# Patient Record
Sex: Male | Born: 1955
Health system: Southern US, Community
[De-identification: ages and names within clinical notes are randomized; demographics above are authoritative.]

## PROBLEM LIST (undated history)

## (undated) DIAGNOSIS — F209 Schizophrenia, unspecified: Secondary | ICD-10-CM

## (undated) DIAGNOSIS — K219 Gastro-esophageal reflux disease without esophagitis: Secondary | ICD-10-CM

## (undated) DIAGNOSIS — R451 Restlessness and agitation: Secondary | ICD-10-CM

## (undated) DIAGNOSIS — E785 Hyperlipidemia, unspecified: Secondary | ICD-10-CM

## (undated) HISTORY — DX: Schizophrenia, unspecified: F20.9

## (undated) HISTORY — DX: Hyperlipidemia, unspecified: E78.5

## (undated) HISTORY — DX: Gastro-esophageal reflux disease without esophagitis: K21.9

## (undated) HISTORY — PX: TONSILLECTOMY: SUR1361

---

## 2003-01-31 ENCOUNTER — Ambulatory Visit (HOSPITAL_COMMUNITY): Admission: RE | Admit: 2003-01-31 | Discharge: 2003-01-31 | Payer: Self-pay | Admitting: Family Medicine

## 2003-10-27 ENCOUNTER — Ambulatory Visit: Payer: Self-pay | Admitting: Psychiatry

## 2004-01-30 ENCOUNTER — Ambulatory Visit: Payer: Self-pay | Admitting: Psychiatry

## 2004-05-24 ENCOUNTER — Ambulatory Visit: Payer: Self-pay | Admitting: Psychiatry

## 2004-08-23 ENCOUNTER — Ambulatory Visit: Payer: Self-pay | Admitting: Psychiatry

## 2004-11-13 ENCOUNTER — Ambulatory Visit: Payer: Self-pay | Admitting: Psychiatry

## 2005-02-07 ENCOUNTER — Ambulatory Visit: Payer: Self-pay | Admitting: Psychiatry

## 2005-05-07 ENCOUNTER — Ambulatory Visit (HOSPITAL_COMMUNITY): Payer: Self-pay | Admitting: Psychiatry

## 2005-08-13 ENCOUNTER — Ambulatory Visit (HOSPITAL_COMMUNITY): Payer: Self-pay | Admitting: Psychiatry

## 2005-11-12 ENCOUNTER — Ambulatory Visit (HOSPITAL_COMMUNITY): Payer: Self-pay | Admitting: Psychiatry

## 2006-04-17 ENCOUNTER — Ambulatory Visit (HOSPITAL_COMMUNITY): Payer: Self-pay | Admitting: Psychiatry

## 2006-08-14 ENCOUNTER — Ambulatory Visit (HOSPITAL_COMMUNITY): Payer: Self-pay | Admitting: Psychiatry

## 2006-12-02 ENCOUNTER — Ambulatory Visit (HOSPITAL_COMMUNITY): Payer: Self-pay | Admitting: Psychiatry

## 2007-04-14 ENCOUNTER — Ambulatory Visit (HOSPITAL_COMMUNITY): Payer: Self-pay | Admitting: Psychiatry

## 2007-08-18 ENCOUNTER — Ambulatory Visit (HOSPITAL_COMMUNITY): Payer: Self-pay | Admitting: Psychiatry

## 2008-01-12 ENCOUNTER — Ambulatory Visit (HOSPITAL_COMMUNITY): Payer: Self-pay | Admitting: Psychiatry

## 2008-05-24 ENCOUNTER — Ambulatory Visit (HOSPITAL_COMMUNITY): Payer: Self-pay | Admitting: Psychiatry

## 2008-07-21 ENCOUNTER — Ambulatory Visit (HOSPITAL_COMMUNITY): Payer: Self-pay | Admitting: Psychiatry

## 2008-09-22 ENCOUNTER — Ambulatory Visit (HOSPITAL_COMMUNITY): Payer: Self-pay | Admitting: Psychiatry

## 2009-02-23 ENCOUNTER — Ambulatory Visit (HOSPITAL_COMMUNITY): Payer: Self-pay | Admitting: Psychiatry

## 2009-06-20 ENCOUNTER — Ambulatory Visit (HOSPITAL_COMMUNITY): Payer: Self-pay | Admitting: Psychiatry

## 2009-09-28 ENCOUNTER — Ambulatory Visit (HOSPITAL_COMMUNITY): Payer: Self-pay | Admitting: Psychiatry

## 2010-03-08 ENCOUNTER — Ambulatory Visit (HOSPITAL_COMMUNITY)
Admission: RE | Admit: 2010-03-08 | Discharge: 2010-03-08 | Payer: Self-pay | Source: Home / Self Care | Attending: Psychiatry | Admitting: Psychiatry

## 2010-06-07 ENCOUNTER — Encounter (INDEPENDENT_AMBULATORY_CARE_PROVIDER_SITE_OTHER): Payer: Medicare Other | Admitting: Psychiatry

## 2010-06-07 DIAGNOSIS — F200 Paranoid schizophrenia: Secondary | ICD-10-CM

## 2010-07-03 NOTE — Group Therapy Note (Signed)
NAME:  NYZIR, DUBOIS NO.:  [id number]   MEDICAL RECORD NO.:  [id number]           PATIENT TYPE:   LOCATION:                                 FACILITY:   PHYSICIAN:  Syed T. Arfeen, M.D.        DATE OF BIRTH:                                 PROGRESS NOTE   The patient came in today with his follow-up appointment with his sister  who came first time with him for his appointment.  The sister endorsed  that the patient has been talking to himself more than usual when she  brought him to her home for a visit.  She is also concerned sometimes  the patient does not like white people and she had another brother whose  girlfriend is white and whenever she comes home the patient feels very  uncomfortable.  However, there are no violence and agitation. Usually  the patient stays  at adult home and very rarely he goes to visit his  sister.  On his last visit with [place] we have increased the lorazepam to  four times a day as the patient was  having anxiety and fear of dying.  The patient reported that he has been doing much better since lorazepam  was increased to four times a day though, he is still appears somewhat  disorganized, talking to self, and laughing to self, but he has been  sleeping good and denies any suicidal thoughts or homicidal thoughts.  His thought process remains disorganized and illogical and his speech  remains incoherent which is his baseline.   I talked to his sister about the medication and dosage:  1. He has been on moderate dose of Thorazine which is 200 mg four      tablets a day.  2. Risperdal 3 mg twice a day.  3. Cogentin 0.5 mg twice a day.  4. Lorazepam 0.5 mg four times a day.   The patient reported no side effects of the medication and felt these  medicines are helping him to calm down.   The patient has no concern living in adult home and he has no issue with  the staff there.   ASSESSMENT:  Schizophrenia, chronic disorganized type.   The patient still has  residual symptoms of disorganized thinking however, there is no  management problem and he has been taking his medication at regular  doses.  He denies any suicidal thoughts homicidal thoughts and appears  cooperative and pleasant.   PLAN:  We will continue his current medication which is:  a.  Thorazine 1200 mg a day.  b.  Haldol 6 mg a day.  c.  Cogentin 0.5 twice a day.  d.  Ativan 0.5 mg four times a day.  I discussed with him and his sister the risk and benefits of the  medication.  I advised his sister to tell his brother not to bring his  white girlfriend at home when the patient is visiting her, which she  acknowledged. I reviewed the blood work which was done on June 3.  All  results are within normal limit including his liver function test and  blood  glucose.  I will see him in 4 months.      Syed T. Lolly Mustache, M.D.  Electronically Signed     STA/MEDQ  D:  09/22/2008  T:  09/22/2008  Job:  [id number]

## 2010-07-05 ENCOUNTER — Encounter (HOSPITAL_COMMUNITY): Payer: Medicare Other | Admitting: Psychiatry

## 2010-07-05 ENCOUNTER — Encounter (INDEPENDENT_AMBULATORY_CARE_PROVIDER_SITE_OTHER): Payer: Medicare Other | Admitting: Psychiatry

## 2010-07-05 DIAGNOSIS — F200 Paranoid schizophrenia: Secondary | ICD-10-CM

## 2010-08-30 ENCOUNTER — Encounter (INDEPENDENT_AMBULATORY_CARE_PROVIDER_SITE_OTHER): Payer: Medicare Other | Admitting: Psychiatry

## 2010-08-30 DIAGNOSIS — F200 Paranoid schizophrenia: Secondary | ICD-10-CM

## 2010-09-27 ENCOUNTER — Encounter (INDEPENDENT_AMBULATORY_CARE_PROVIDER_SITE_OTHER): Payer: Medicare Other | Admitting: Psychiatry

## 2010-09-27 DIAGNOSIS — F200 Paranoid schizophrenia: Secondary | ICD-10-CM

## 2010-11-08 ENCOUNTER — Encounter (INDEPENDENT_AMBULATORY_CARE_PROVIDER_SITE_OTHER): Payer: Medicare Other | Admitting: Psychiatry

## 2010-11-08 DIAGNOSIS — F200 Paranoid schizophrenia: Secondary | ICD-10-CM

## 2010-12-06 ENCOUNTER — Encounter (INDEPENDENT_AMBULATORY_CARE_PROVIDER_SITE_OTHER): Payer: Medicare Other | Admitting: Psychiatry

## 2010-12-06 DIAGNOSIS — F259 Schizoaffective disorder, unspecified: Secondary | ICD-10-CM

## 2010-12-07 NOTE — Progress Notes (Signed)
NAMEKOREN, SERMERSHEIM NO.:  [id number]  MEDICAL RECORD NO.:  [id number]  LOCATION:  [facility]                           FACILITY:  [facility]  PHYSICIAN:  Staley Budzinski T. Scotlynn Noyes, M.D.   DATE OF BIRTH:  1955-12-22                                PROGRESS NOTE   The patient came in today with the staff off the adult home for his appointment.  On the last visit we had kept the same medication as the patient was noticed more irritable, angry and easily agitated.  He is taking multiple psychotropic medications including 2 antipsychotic medications.  We are in the process of slow titration of the Risperdal. At some point would like him to be on 1 antipsychotic medication, Thorazine.  As per the staff the patient has been doing better in past 4 weeks.  He has been sleeping good and has no further episodes of agitation or anger, though he remains preoccupied and disorganized at times but not a management problem at adult home.  I reviewed his medications in length.  Per the staff the patient has been sleeping good and has no issue taking his.  CURRENT MEDICATIONS: 1. Polyethylene glycol 3350 one capsule in 8 ounces of liquid once a     day. 2. Lorazepam 0.5 mg 4 times a day. 3. Omeprazole 20 mg 1 capsule twice a day. 4. Thorazine 200 mg tablet 1 in the morning, 1 at noon and 4 at     bedtime. 5. Benztropine 0.5 mg twice a day. 6. Vitamin D 2000 international unit 1 capsule twice a day. 7. Risperdal 2 mg 1 tablet twice a day. 8. Amitiza 24 mcg 1 capsule a day.  ALLERGIES: The patient has no known drug allergies. Weight, His weight today was 192 pounds.  Mental status examination. The patient is fairly groomed and appears to be in his stated age.  He is unshaved and has some dirt on his clothes.  His speech is at time incoherent, thought process was illogical, less goal-directed but he was cooperative.  He has limited eye contact.  He continued to be noticed talking to himself as  looked like responding to internal stimuli, which has been his baseline.  He denies any suicidal thoughts or homicidal thoughts.  His attention and concentration was fair.  Thought process was circumstantial.  But able to follow directions.  He is alert and oriented x2.  His insight judgment and impulse control is fair.  DIAGNOSES: AXIS I: Schizophrenia, disorganized type. AXIS II:  Deferred. AXIS III:  Mild obesity, dyslipidemia, gastroesophageal reflux disease. AXIS IV:  Mild to moderate. AXIS V:  50.  PATIENT PRIMARY CARE DOCTOR: Colon Branch, MD.  PLAN OF TREATMENT: We will try to reduce further Risperdal to 1 mg twice a day, as patient is already on Thorazine with moderate dose.  He will continue his Cogentin and lorazepam from his primary care doctor.  I explained the risks and benefits of medication.  I also explained to the staff that if he started to get more irritable angry and agitated they need to call us immediately.  I will see him again in 4 weeks.  Jatasia Gundrum T. Lolly Mustache, M.D.     STA/MEDQ  D:  12/06/2010  T:  12/06/2010  Job:  [id number]  Electronically Signed by Kathryne Sharper M.D. on 12/07/2010 11:23:11 AM

## 2011-01-03 ENCOUNTER — Encounter (HOSPITAL_COMMUNITY): Payer: Self-pay | Admitting: Psychiatry

## 2011-01-03 ENCOUNTER — Ambulatory Visit (INDEPENDENT_AMBULATORY_CARE_PROVIDER_SITE_OTHER): Payer: Medicare Other | Admitting: Psychiatry

## 2011-01-03 ENCOUNTER — Encounter (HOSPITAL_COMMUNITY): Payer: Medicare Other | Admitting: Psychiatry

## 2011-01-03 VITALS — Wt 189.0 lb

## 2011-01-03 DIAGNOSIS — F259 Schizoaffective disorder, unspecified: Secondary | ICD-10-CM

## 2011-01-03 MED ORDER — RISPERIDONE 1 MG PO TABS: 1.0000 mg | ORAL_TABLET | Freq: Two times a day (BID) | ORAL | Status: DC

## 2011-01-03 MED ORDER — LORAZEPAM 0.5 MG PO TABS: 0.5000 mg | ORAL_TABLET | Freq: Four times a day (QID) | ORAL | Status: DC

## 2011-01-03 MED ORDER — CHLORPROMAZINE HCL 200 MG PO TABS: ORAL_TABLET | ORAL | Status: DC

## 2011-01-03 MED ORDER — DIVALPROEX SODIUM ER 250 MG PO TB24: 250.0000 mg | ORAL_TABLET | Freq: Two times a day (BID) | ORAL | Status: DC

## 2011-01-03 MED ORDER — BENZTROPINE MESYLATE 0.5 MG PO TABS: 0.5000 mg | ORAL_TABLET | Freq: Two times a day (BID) | ORAL | Status: DC

## 2011-01-03 NOTE — Telephone Encounter (Signed)
This encounter was created in error - please disregard.

## 2011-01-03 NOTE — Progress Notes (Signed)
Patient came today for his followup appointment with the staff of adult home. The staff endorse that patient has been seen more irritable angry and agitated and even of police to rescue him. On weekends he was very irritable and and seen talking to himself. On last visit we have reduce his Risperdal as patient is taking already Thorazine in a moderate does. The [place] process of lowering his Risperdal to like to keep him on one antipsychotic. During the visit patient was seen constantly talking to himself easily irritable and at times accusing the staff member that maybe he is getting poisons from someone else. The staff noticed that he is also not sleeping very well and remains very preoccupied with himself. Though there is no aggression and one behavior but at times he need constant redirection and counseling. I review his chart patient has long-standing schizophrenia [facility] he has been taking 2 antipsychotic for a long time but his current regime does not show that he is on any mood stabilizer.  Current medication. Lorazepam 0.5 mg 4 times a day because of poorly milligram 1 capsule twice a day Thorazine 200 mg one in the morning one at noon and 4 at bedtime benztropine 0.5 mg twice a day vitamin D 2000 international unit 1 capsule twice a day Risperdal 1 mg twice a day and Amitiza 24 mcg  capsule a day  Medical history hyperlipidemia and GERD vitamin D deficiency IBS  Mental status examination Patient is poorly groomed and appears restless. He is unshaved and have some dirt on his clothes. His his speech is incoherent and his thought processes are logical and less goal-directed. He is fairly cooperative and did not show any signs of agitation or anger but his attention and concentration is poor. He was noticed a continuously talking to himself but he denies any active or passive suicidal thinking. He is notice more hypervigilant but able to follow directions. His  thought processes disorganized and his speech is rambling and incoherent. He endorsed some paranoid thinking about the staff but there were no delusions noted he has no extrapyramidal side effects noted. He is alert and oriented x2 he has difficulty recalling old events. His insight judgment is poor and impulse control is fair.  Assessment. Axis I Schizophrenia chronic disorganized type Axis II deferred Axis III hyperlipidemia and GERD Axis IV moderate Axis V 45 Plan I will start Depakote 250 mg twice a day for his mood lability and paranoid thinking at this time the will not further reduce the Risperdal and he will continue Risperdal 1 mg twice a day along with Thorazine 800 mg a day I will continue his Cogentin and Ativan which he requires on a daily basis. I explained to the staff member if he start getting more agitated and violent then call 911 go to local ER. I will see him again in 3 weeks. I explained the risks and benefits of medication in detail. Time spent 30 minutes

## 2011-01-31 ENCOUNTER — Ambulatory Visit (INDEPENDENT_AMBULATORY_CARE_PROVIDER_SITE_OTHER): Payer: Medicare Other | Admitting: Psychiatry

## 2011-01-31 DIAGNOSIS — F209 Schizophrenia, unspecified: Secondary | ICD-10-CM

## 2011-01-31 MED ORDER — RISPERIDONE 2 MG PO TABS: 2.0000 mg | ORAL_TABLET | Freq: Two times a day (BID) | ORAL | Status: DC

## 2011-01-31 NOTE — Progress Notes (Signed)
Patient came today for his followup appointment with the staff of adult home. The staff endorse that patient has been seen more irritable angry and agitated. He is talking loud and praying most of the time.  We are trying to reduce his Risperdal and we have started Depakote on his last visit however patient started to see more decompensated he's been more irritable angry and required frequent redirection by the staff in group. He is also taking Thorazine 800 mg. I reviewed the chart patient did very well to the combination of Thorazine and Risperdal. Patient carries history of disorganized schizophrenia with repeated hospitalization. At this time I do believe he may need 2 antipsychotic to minimize his symptoms. I do believe Depakote is not helping and working for his illness. Patient has reported no side effects of his current medication.  Current medication. Lorazepam 0.5 mg 4 times a day because of poorly milligram 1 capsule twice a day Thorazine 200 mg one in the morning one at noon and 4 at bedtime benztropine 0.5 mg twice a day vitamin D 2000 international unit 1 capsule twice a day Risperdal 1 mg twice a day and Amitiza 24 mcg  capsule a day  Medical history hyperlipidemia and GERD vitamin D deficiency IBS  Mental status examination Patient is seen more irritable and restless.He is unshaved and have some dirt on his clothes. His his speech is incoherent and his thought processes are logical and less goal-directed. He is fairly cooperative and did not show any signs of agitation or anger but his attention and concentration is poor. He was noticed a continuously talking to himself but he denies any active or passive suicidal thinking. He is notice more hypervigilant but able to follow directions. His thought processes disorganized and his speech is rambling and incoherent. He endorsed some paranoid thinking about the staff but there were no delusions noted he has no extrapyramidal side effects noted. He  is alert and oriented x2 he has difficulty recalling old events. His insight judgment is poor and impulse control is fair.  Assessment. Axis I Schizophrenia chronic disorganized type Axis II deferred Axis III hyperlipidemia and GERD Axis IV moderate Axis V 45  Plan I will discontinue Depakote and restart Risperdal 2 mg twice a day. Patient was taking Risperdal 1 mg however he started to showing decompensation and he need to increase the dose. He will continue Thorazine 800 mg a day along with Cogentin and Ativan which he requires on a daily basis. I explained to the staff member if he start getting more agitated and violent then call 911 go to local ER. I will see him again in 6 weeks. I explained the risks and benefits of medication in detail.

## 2011-03-14 ENCOUNTER — Ambulatory Visit (INDEPENDENT_AMBULATORY_CARE_PROVIDER_SITE_OTHER): Payer: Medicare Other | Admitting: Psychiatry

## 2011-03-14 ENCOUNTER — Encounter (HOSPITAL_COMMUNITY): Payer: Self-pay | Admitting: Psychiatry

## 2011-03-14 VITALS — Wt 191.0 lb

## 2011-03-14 DIAGNOSIS — Z79899 Other long term (current) drug therapy: Secondary | ICD-10-CM

## 2011-03-14 DIAGNOSIS — F209 Schizophrenia, unspecified: Secondary | ICD-10-CM

## 2011-03-14 MED ORDER — RISPERIDONE 3 MG PO TABS: 3.0000 mg | ORAL_TABLET | Freq: Two times a day (BID) | ORAL | Status: DC

## 2011-03-14 NOTE — Progress Notes (Signed)
Patient came today for his followup appointment with the staff of adult home. As per staff patient continues to endorse agitation anger and mood swings. As per the staff patient refuses to eat with close eyes and talk to himself in the night. In the past we have tried to lower his Risperdal as patient is taking 2 antipsychotic medication but it seems lowering Risperdal is not helping him. He has been seen more agitated anger and more psychotic. The staff told that he is very loud and cursing but there were no violent episode. He talks to himself very loud and usually praying with no schedule time.  He is taking Thorazine 800 mg. on her last visit we did increase Risperdal from 1 mg to 2 mg twice a day however patient is still has residual psychosis and inappropriate behavior at times. In the past patient was taking Risperdal 3 mg along with Thorazine 800 mg. She tried mood stabilizers also which did not help him. I reviewed the chart patient did very well to the combination of Thorazine and Risperdal. Patient carries history of disorganized schizophrenia with repeated hospitalization. At this time I do believe he may need 2 antipsychotic to minimize his symptoms. I do believe we need to increase his Risperdal to 3 mg twice. Patient and staff reported no side effects of his current medication.  Current medication. Lorazepam 0.5 mg 4 times a day, Thorazine 200 mg one in AM one in noon and 4 at bedtime benztropine 0.5 mg twice a day  vitamin D 2000 international unit 1 capsule twice a day Risperdal 2 mg twice a day and  Amitiza 24 mcg  capsule a day  Medical history hyperlipidemia and GERD vitamin D deficiency IBS  Mental status examination Patient is fairly groomed and disheveled sHe has unshaved beard. He maintained poor eye contact.  His his speech is incoherent and his thought processes are logical and less goal-directed. He is fairly cooperative and did not show any signs of agitation or anger but his  attention and concentration is poor. He was noticed a continuously talking to himself but he denies any active or passive suicidal thinking. He is notice more hypervigilant but able to follow directions. His thought processes disorganized and his speech is rambling and incoherent. He endorsed some paranoid thinking about the staff but there were no delusions noted he has no extrapyramidal side effects noted. He is alert and oriented x2 he has difficulty recalling old events. His insight judgment is poor and impulse control is fair.  Assessment. Axis I Schizophrenia chronic disorganized type Axis II deferred Axis III hyperlipidemia and GERD Axis IV moderate Axis V 45  Plan  I will increase his Risperdal to 3 mg twice a day which he was taking 3 months ago. I will continue all his other medications as prescribed. I will also get blood work including CBC CMP and hemoglobin A1c.  I explained to the staff member if he start getting more agitated and violent then call 911 go to local ER. I will see him again in 4 weeks. I explained the risks and benefits of medication in detail.  Time spent 30 minutes

## 2011-03-29 ENCOUNTER — Other Ambulatory Visit (HOSPITAL_COMMUNITY): Payer: Self-pay | Admitting: Psychiatry

## 2011-03-31 ENCOUNTER — Other Ambulatory Visit (HOSPITAL_COMMUNITY): Payer: Self-pay | Admitting: Psychiatry

## 2011-04-08 ENCOUNTER — Telehealth (HOSPITAL_COMMUNITY): Payer: Self-pay | Admitting: *Deleted

## 2011-04-08 DIAGNOSIS — F209 Schizophrenia, unspecified: Secondary | ICD-10-CM | POA: Diagnosis not present

## 2011-04-08 DIAGNOSIS — Z79899 Other long term (current) drug therapy: Secondary | ICD-10-CM | POA: Diagnosis not present

## 2011-04-08 NOTE — Telephone Encounter (Signed)
[id number]. Patient presented without lab orders.Dr.Arfeen had sent labs to [facility].Pt did not go to [facility].Printed lab request  in [place] office, Dr.Arfeen signed, and faxed to [facility].Lab @ [phone].

## 2011-04-16 ENCOUNTER — Encounter (HOSPITAL_COMMUNITY): Payer: Self-pay | Admitting: Psychiatry

## 2011-04-16 ENCOUNTER — Ambulatory Visit (INDEPENDENT_AMBULATORY_CARE_PROVIDER_SITE_OTHER): Payer: Medicare Other | Admitting: Psychiatry

## 2011-04-16 VITALS — Wt 191.0 lb

## 2011-04-16 DIAGNOSIS — F209 Schizophrenia, unspecified: Secondary | ICD-10-CM

## 2011-04-16 MED ORDER — CHLORPROMAZINE HCL 200 MG PO TABS: ORAL_TABLET | ORAL | Status: DC

## 2011-04-16 MED ORDER — BENZTROPINE MESYLATE 0.5 MG PO TABS: 0.5000 mg | ORAL_TABLET | Freq: Two times a day (BID) | ORAL | Status: DC

## 2011-04-16 MED ORDER — RISPERIDONE 3 MG PO TABS: 3.0000 mg | ORAL_TABLET | Freq: Two times a day (BID) | ORAL | Status: DC

## 2011-04-16 MED ORDER — LORAZEPAM 0.5 MG PO TABS: 0.5000 mg | ORAL_TABLET | Freq: Four times a day (QID) | ORAL | Status: DC

## 2011-04-16 NOTE — Progress Notes (Signed)
Chief complaint Medication management and refills  History of presenting illness Patient is a 56 year old [place] American male who came for his followup appointment with the staff of adult home. Since we have increased the Risperdal to 3 mg twice a day he has been doing better. Though he continues to have anger and agitation but overall his mood has been stable. He is sleeping fine. He has not calm management problem. Though he talks to himself at times but does not require redirections. Patient endorse he talk to his God to calm himself down. Patient reported no side effects of medication. He is also on Thorazine. We tried to her Risperdal to keep him on antipsychotic medication however patient decompensated.  At this time I do believe he may need 2 antipsychotic to minimize his symptoms. He has blood work done at [facility] however we are waiting for the results.   Current medication. Lorazepam 0.5 mg 4 times a day, Thorazine 200 mg one in AM one in noon and 4 at bedtime, benztropine 0.5 mg twice a day  vitamin D 2000 international unit 1 capsule twice a day Risperdal 2 mg twice a day and  Amitiza 24 mcg  capsule a day  Medical history Hyperlipidemia and GERD vitamin D deficiency IBS  Mental status examination Patient is fairly groomed and disheveled He has unshaved beard. He maintained limited eye contact.  His his speech is incoherent and his thought processes are illogical. He is fairly cooperative and did not show any signs of agitation or anger but his attention and concentration is poor. He was quite today as compared to the past visit. He also smiles during the session. However he was seen at times talking to himself but he denies any active or passive suicidal thinking. He is cooperative and able to follow directions. His thought processes disorganized and his speech is rambling and incoherent. He endorsed talking to his God. He has no extrapyramidal side effects noted. He is alert  and oriented x2 he has difficulty recalling old events. His insight judgment is poor and impulse control is fair.  Assessment. Axis I Schizophrenia chronic disorganized type Axis II deferred Axis III hyperlipidemia and GERD Axis IV moderate Axis V 45  Plan  I will continue his Risperdal to 3 mg twice a day along with Thorazine 800 mg at a. We will get his results of loss blood work.   I explained to the staff member if he start getting more agitated and violent then call 911 go to local ER. I will see him again in 8 weeks. I explained the risks and benefits of medication in detail.

## 2011-04-16 NOTE — Patient Instructions (Signed)
Please send [place] a copy of recent blood work and current medication list.

## 2011-05-06 DIAGNOSIS — K5900 Constipation, unspecified: Secondary | ICD-10-CM | POA: Diagnosis not present

## 2011-06-13 ENCOUNTER — Encounter (HOSPITAL_COMMUNITY): Payer: Self-pay | Admitting: Psychiatry

## 2011-06-13 ENCOUNTER — Ambulatory Visit (INDEPENDENT_AMBULATORY_CARE_PROVIDER_SITE_OTHER): Payer: Medicare Other | Admitting: Psychiatry

## 2011-06-13 VITALS — BP 100/56 | HR 101 | Wt 191.0 lb

## 2011-06-13 DIAGNOSIS — F201 Disorganized schizophrenia: Secondary | ICD-10-CM

## 2011-06-13 DIAGNOSIS — F209 Schizophrenia, unspecified: Secondary | ICD-10-CM

## 2011-06-13 MED ORDER — RISPERIDONE 3 MG PO TABS: 3.0000 mg | ORAL_TABLET | Freq: Two times a day (BID) | ORAL | Status: DC

## 2011-06-13 MED ORDER — CHLORPROMAZINE HCL 200 MG PO TABS: ORAL_TABLET | ORAL | Status: DC

## 2011-06-13 MED ORDER — BENZTROPINE MESYLATE 0.5 MG PO TABS: 0.5000 mg | ORAL_TABLET | Freq: Two times a day (BID) | ORAL | Status: DC

## 2011-06-13 MED ORDER — LORAZEPAM 0.5 MG PO TABS: 0.5000 mg | ORAL_TABLET | Freq: Four times a day (QID) | ORAL | Status: DC

## 2011-06-13 NOTE — Patient Instructions (Signed)
Please continue Ollie her medication.  Please send any recent blood work results.  Followup in 2 months.

## 2011-06-13 NOTE — Progress Notes (Addendum)
Chief complaint Medication management and refills  History of presenting illness Patient is a 56 year old [place] male who came for his followup appointment with the staff of adult home.  Patient despite having hallucinations and hearing voices is not a management problem that facility.  He is cooperative and able to follow rules.  There has been no recent incident when he's been violent or aggressive.  Patient continued to endorse talking to his God to calm himself.  He is a poor historian and did not provide much information.  He's been compliant with medication and do not endorse any side effects.  As per staff patient sleeps better.  His appetite and weight has been unchanged.  There are no new medication added from his primary care physician.  In the past we have tried cutting down 1 antipsychotic however patient relapsed and does require to antipsychotic medication.  His last blood work in February this year was within normal limits.  His blood sugar was 100 and he has a normal kidney and liver function.   Current medication. Lorazepam 0.5 mg 4 times a day, Thorazine 200 mg one in AM one in noon and 4 at bedtime, benztropine 0.5 mg twice a day  vitamin D 2000 international unit 1 capsule twice a day Risperdal 3 mg twice a day and  Amitiza 24 mcg  capsule a day  Past psychiatric history Patient has significant history of schizophrenia disorganized type.  [facility]  He do not recall the details of these admission however since 1999 he has been not admitted to psychiatric hospital.  He seen in this office since 1999 and has been managed on 2 antipsychotic medication.  Patient do not recall if he ever had any suicidal attempt.  Medical history Hyperlipidemia and GERD vitamin D deficiency IBS  Mental status examination Patient is fairly groomed and disheveled He has unshaved beard. He maintained limited eye contact.  His his  speech is incoherent and his thought processes are illogical. He is fairly cooperative and did not show any signs of agitation or anger but his attention and concentration is poor. He was notice talking to himself during the conversation.  He also smiles during the session.  He denies any active or passive suicidal thinking. He is cooperative and able to follow directions. His thought processes disorganized and his speech is rambling and incoherent. He endorsed talking to his God. He has no extrapyramidal side effects noted. He is alert and oriented x2 he has difficulty recalling old events. His insight judgment is poor and impulse control is fair.  Assessment. Axis I Schizophrenia chronic disorganized type Axis II deferred Axis III hyperlipidemia and GERD Axis IV moderate Axis V 45  Plan  I review patient's history , current medication in recent blood work.  I will continue his Risperdal to 3 mg twice a day along with Thorazine 800 mg. patient does require to antipsychotic medication to stabilize his illness.  He will continue to take Cogentin and lorazepam as prescribed. I explained to the staff member if he start getting more agitated and violent then call 911 go to local ER. I will see him again in 8 weeks. I explained the risks and benefits of medication in detail.   Time spent 30 minutes.

## 2011-07-04 DIAGNOSIS — K5289 Other specified noninfective gastroenteritis and colitis: Secondary | ICD-10-CM | POA: Diagnosis not present

## 2011-08-13 ENCOUNTER — Encounter (HOSPITAL_COMMUNITY): Payer: Self-pay | Admitting: Psychiatry

## 2011-08-13 ENCOUNTER — Ambulatory Visit (INDEPENDENT_AMBULATORY_CARE_PROVIDER_SITE_OTHER): Payer: Medicare Other | Admitting: Psychiatry

## 2011-08-13 VITALS — BP 99/59 | HR 98 | Wt 194.0 lb

## 2011-08-13 DIAGNOSIS — F201 Disorganized schizophrenia: Secondary | ICD-10-CM

## 2011-08-13 DIAGNOSIS — F209 Schizophrenia, unspecified: Secondary | ICD-10-CM

## 2011-08-13 MED ORDER — LORAZEPAM 0.5 MG PO TABS: 0.5000 mg | ORAL_TABLET | Freq: Four times a day (QID) | ORAL | Status: DC

## 2011-08-13 MED ORDER — CHLORPROMAZINE HCL 200 MG PO TABS: ORAL_TABLET | ORAL | Status: DC

## 2011-08-13 MED ORDER — RISPERIDONE 3 MG PO TABS: 3.0000 mg | ORAL_TABLET | Freq: Two times a day (BID) | ORAL | Status: DC

## 2011-08-13 MED ORDER — BENZTROPINE MESYLATE 0.5 MG PO TABS: 0.5000 mg | ORAL_TABLET | Freq: Two times a day (BID) | ORAL | Status: DC

## 2011-08-13 NOTE — Patient Instructions (Signed)
Please continue his current psychiatric medication.  Please faxed [place] his blood work at [phone]. I will see him again in 2 months.

## 2011-08-13 NOTE — Progress Notes (Addendum)
Chief complaint Medication management and refills  History of presenting illness Patient is a 56 year old [place] American male who came for his followup appointment with the staff of adult home.  Patient is compliant with the medication and reported no side effects.  He is not a management problem at facility.  He continues to endorse hallucination paranoid thinking and some time disorganized behavior but he has been not violent or aggressive.  He admitted talking to the God to calm himself.  He likes his current psychiatric medication.  His sleep and appetite is unchanged from the past.  Patient is scheduled to have blood work next month.  He is taking 2 antipsychotic medication.  In the past he had tried to cut down one antipsychotic medication however patient relapsed and requires to go back on 2 antipsychotic.  His ADL is okay.  Staff has no concern about his behavior.  He's not drinking or using any illegal substance.  However he continued to smoke.  Denies any agitation anger or crying spells.  Current medication. Lorazepam 0.5 mg 4 times a day, Thorazine 200 mg one in AM one in noon and 4 at bedtime, benztropine 0.5 mg twice a day  vitamin D 2000 international unit 1 capsule twice a day Risperdal 3 mg twice a day and  Amitiza 24 mcg  capsule a day  Past psychiatric history Patient has significant history of schizophrenia disorganized type.  [facility]  He do not recall the details of these admission however since 1999 he has been not admitted to psychiatric hospital.  He seen in this office since 1999 and has been managed on 2 antipsychotic medication.  Patient do not recall if he ever had any suicidal attempt.  Medical history Hyperlipidemia and GERD vitamin D deficiency IBS  Mental status examination Patient is fairly groomed and dressed.  He maintained limited eye contact.  His his speech is incoherent and his thought processes  are illogical. He is fairly cooperative and did not show any signs of agitation or anger but his attention and concentration is poor. He was notice talking to himself during the conversation.  He also smiles during the session.  He denies any active or passive suicidal thinking. He is cooperative and able to follow directions. His thought processes disorganized and his speech is rambling and incoherent. He endorsed talking to his God. He has no extrapyramidal side effects noted. He is alert and oriented x2 he has difficulty recalling old events. His insight judgment is poor and impulse control is fair.  Assessment. Axis I Schizophrenia chronic disorganized type Axis II deferred Axis III hyperlipidemia and GERD Axis IV moderate Axis V 45  Plan  I review patient's history , current medication and response to the medication.  Patient requires 2 antipsychotic medication to stabilize his illness.  He will continue to take Risperdal, Thorazine, Cogentin and lorazepam as prescribed. I explained to the staff member if he start getting more agitated and violent then call 911 go to local ER. I will see him again in 8 weeks. I explained the risks and benefits of medication in detail.  Patient is scheduled to have blood work next month. Time spent 30 minutes.

## 2011-08-26 ENCOUNTER — Other Ambulatory Visit (HOSPITAL_COMMUNITY): Payer: Self-pay | Admitting: Psychiatry

## 2011-08-27 ENCOUNTER — Other Ambulatory Visit (HOSPITAL_COMMUNITY): Payer: Self-pay | Admitting: Psychiatry

## 2011-08-27 NOTE — Telephone Encounter (Signed)
Given new prescription on 08/13/11 with refill.

## 2011-08-28 ENCOUNTER — Other Ambulatory Visit (HOSPITAL_COMMUNITY): Payer: Self-pay | Admitting: *Deleted

## 2011-08-28 DIAGNOSIS — F209 Schizophrenia, unspecified: Secondary | ICD-10-CM

## 2011-08-28 MED ORDER — RISPERIDONE 3 MG PO TABS: 3.0000 mg | ORAL_TABLET | Freq: Two times a day (BID) | ORAL | Status: DC

## 2011-08-29 ENCOUNTER — Other Ambulatory Visit (HOSPITAL_COMMUNITY): Payer: Self-pay | Admitting: Psychiatry

## 2011-10-03 DIAGNOSIS — K5900 Constipation, unspecified: Secondary | ICD-10-CM | POA: Diagnosis not present

## 2011-10-15 ENCOUNTER — Ambulatory Visit (HOSPITAL_COMMUNITY): Payer: Self-pay | Admitting: Psychiatry

## 2011-11-14 DIAGNOSIS — K5289 Other specified noninfective gastroenteritis and colitis: Secondary | ICD-10-CM | POA: Diagnosis not present

## 2011-12-20 ENCOUNTER — Ambulatory Visit (HOSPITAL_COMMUNITY): Payer: Self-pay | Admitting: Psychiatry

## 2011-12-30 ENCOUNTER — Ambulatory Visit (INDEPENDENT_AMBULATORY_CARE_PROVIDER_SITE_OTHER): Payer: Medicare Other | Admitting: Psychiatry

## 2011-12-30 ENCOUNTER — Encounter (HOSPITAL_COMMUNITY): Payer: Self-pay | Admitting: Psychiatry

## 2011-12-30 VITALS — BP 112/76 | HR 84 | Ht 70.5 in | Wt 181.2 lb

## 2011-12-30 DIAGNOSIS — F201 Disorganized schizophrenia: Secondary | ICD-10-CM | POA: Diagnosis not present

## 2011-12-30 DIAGNOSIS — F5105 Insomnia due to other mental disorder: Secondary | ICD-10-CM | POA: Insufficient documentation

## 2011-12-30 DIAGNOSIS — F209 Schizophrenia, unspecified: Secondary | ICD-10-CM

## 2011-12-30 MED ORDER — BENZTROPINE MESYLATE 0.5 MG PO TABS: 0.5000 mg | ORAL_TABLET | Freq: Two times a day (BID) | ORAL | Status: DC

## 2011-12-30 MED ORDER — LORAZEPAM 0.5 MG PO TABS: 0.5000 mg | ORAL_TABLET | Freq: Four times a day (QID) | ORAL | Status: DC

## 2011-12-30 MED ORDER — CHLORPROMAZINE HCL 200 MG PO TABS: ORAL_TABLET | ORAL | Status: DC

## 2011-12-30 MED ORDER — RISPERIDONE 3 MG PO TABS: 3.0000 mg | ORAL_TABLET | Freq: Two times a day (BID) | ORAL | Status: DC

## 2011-12-30 NOTE — Patient Instructions (Addendum)
Play Bingo once a week  Start playing some kind of musical instrument  5 showers a week with washing hair good every time.  Go on walk and behold beauty each time every day.

## 2011-12-30 NOTE — Progress Notes (Signed)
Chief complaint Medication management and refills  History of presenting illness Patient is a 56 year old [place] American male who came for his followup appointment with the staff of adult home. Pt mumbles a lot in the office setting.  He says that he is praying.  The staff note that he does mumble a lot and will be talking and make you think that he is talking to you and come to find out he is talking to God.  Staff note no changes or problems with him.  Pt denies any problems either.  No stiffness noted in both wrists and elbows.   Current medication. Lorazepam 0.5 mg 4 times a day, Thorazine 200 mg one in AM one in noon and 4 at bedtime, benztropine 0.5 mg twice a day  vitamin D 2000 international unit 1 capsule twice a day Risperdal 3 mg twice a day and  Amitiza 24 mcg  capsule a day  Past psychiatric history Patient has significant history of schizophrenia disorganized type.  He [facility].  He do not recall the details of these admission however since 1999 he has been not admitted to psychiatric hospital.  He seen in this office since 1999 and has been managed on 2 antipsychotic medication.  Patient do not recall if he ever had any suicidal attempt.  Medical history Hyperlipidemia and GERD vitamin D deficiency IBS  Mental status examination Patient is fairly groomed and dressed.  He maintained limited eye contact.  His speech is incoherent and his thought processes are illogical. He is fairly cooperative and did not show any signs of agitation or anger but his attention and concentration is poor. He was notice talking to himself during the conversation.  He also smiles randomly during the session.  He denies any active or passive suicidal thinking. He is cooperative and able to follow directions. His thought processes disorganized and his speech is rambling and incoherent. He endorsed talking to his God. He has no extrapyramidal side  effects noted. He is alert noted the date close to today he has difficulty recalling old events. His insight judgment is poor and impulse control is fair.  Assessment. Axis I Schizophrenia chronic disorganized type Axis II deferred Axis III hyperlipidemia and GERD Axis IV moderate Axis V 45  Plan  I reviewed CC, tobacco history, medical and surgical history along with family history as well as meds and side effects.  Checked vitals and wrists and elbows for stiffness.  Renewed medications adn changed pharmacy to [facility], as the management of the adult home has changed and they use a different pharmacy.  RTC 2 months 15 minutes.  Call 911 if problems.  Recommend that he take 5 baths a week and wash hair each time. Bingo once a week. Making some kind of music will also be helpful.

## 2012-03-02 ENCOUNTER — Ambulatory Visit (HOSPITAL_COMMUNITY): Payer: Self-pay | Admitting: Psychiatry

## 2012-03-05 ENCOUNTER — Ambulatory Visit (HOSPITAL_COMMUNITY): Payer: Self-pay | Admitting: Psychiatry

## 2012-03-09 ENCOUNTER — Ambulatory Visit (INDEPENDENT_AMBULATORY_CARE_PROVIDER_SITE_OTHER): Payer: Medicare Other | Admitting: Psychiatry

## 2012-03-09 ENCOUNTER — Encounter (HOSPITAL_COMMUNITY): Payer: Self-pay | Admitting: Psychiatry

## 2012-03-09 VITALS — Wt 176.6 lb

## 2012-03-09 DIAGNOSIS — F201 Disorganized schizophrenia: Secondary | ICD-10-CM | POA: Diagnosis not present

## 2012-03-09 DIAGNOSIS — F209 Schizophrenia, unspecified: Secondary | ICD-10-CM

## 2012-03-09 DIAGNOSIS — F5105 Insomnia due to other mental disorder: Secondary | ICD-10-CM

## 2012-03-09 MED ORDER — CHLORPROMAZINE HCL 200 MG PO TABS: ORAL_TABLET | ORAL | Status: DC

## 2012-03-09 MED ORDER — RISPERIDONE 3 MG PO TABS: 3.0000 mg | ORAL_TABLET | Freq: Two times a day (BID) | ORAL | Status: DC

## 2012-03-09 MED ORDER — BENZTROPINE MESYLATE 0.5 MG PO TABS: 0.5000 mg | ORAL_TABLET | Freq: Two times a day (BID) | ORAL | Status: DC

## 2012-03-09 MED ORDER — LORAZEPAM 0.5 MG PO TABS: 0.5000 mg | ORAL_TABLET | Freq: Four times a day (QID) | ORAL | Status: DC

## 2012-03-09 NOTE — Progress Notes (Addendum)
[facility] [id number] Progress Note Theodore Smith MRN: [id number] DOB: Mar 19, 1955 Age: 57 y.o.  Date: 03/09/2012 Start Time: 2:58 PM End Time: 3:12 PM  Chief Complaint: Chief Complaint  Patient presents with  . Paranoid  . Follow-up  . Medication Refill   History of presenting illness Patient is a 57 year old [place] American male who came for his followup appointment with the staff of adult home. Pt reports that he is compliant with the psychotropic medications with good benefit and no noticeable side effects.  He says that he is almost ready to have another stage of life.  He mumbles things under his breath and repeats what other's say to him.  He got his cholesterol checked by his family doctor.  Will get those results faxed here. With the doctor's order he received at the last visit he has been compliant with bathing every other day.  He is given an order to walk every at today's visit.   Current medication. Lorazepam 0.5 mg 4 times a day, Thorazine 200 mg one in AM one in noon and 4 at bedtime, benztropine 0.5 mg twice a day  vitamin D 2000 international unit 1 capsule twice a day Risperdal 3 mg twice a day and  Amitiza 24 mcg  capsule a day  Past psychiatric history Patient has significant history of schizophrenia disorganized type.  He has [facility].  He do not recall the details of these admission however since 1999 he has been not admitted to psychiatric hospital.  He seen in this office since 1999 and has been managed on 2 antipsychotic medication.  Patient do not recall if he ever had any suicidal attempt.  Medical history Hyperlipidemia and GERD vitamin D deficiency IBS  Mental status examination Patient is fairly groomed and dressed.  He maintained limited eye contact.  His speech is incoherent and his thought processes are illogical. He is fairly cooperative and did not show any signs of agitation or anger but  his attention and concentration is poor. He was notice talking to himself during the conversation.  He also smiles randomly during the session.  He denies any active or passive suicidal thinking. He is cooperative and able to follow directions. His thought processes disorganized and his speech is rambling and incoherent. He endorsed talking to his God. He has no extrapyramidal side effects noted. He is alert noted the date close to today he has difficulty recalling old events. His insight judgment is poor and impulse control is fair.  Assessment. Axis I Schizophrenia chronic disorganized type Axis II deferred Axis III hyperlipidemia and GERD Axis IV moderate Axis V 45  Plan/Discussion/Summary: I took his vitals.  I reviewed CC, tobacco/med/surg Hx, meds effects/ side effects, problem list, therapies and responses as well as current situation/symptoms discussed options. See orders and pt instructions for more details.  Orson Aloe, MD, [facility]

## 2012-03-09 NOTE — Patient Instructions (Addendum)
KEEP ALL MEDICATIONS GOING THE SAME!!!!  Go for a walk everyday.  Call if problems or concerns.

## 2012-06-10 ENCOUNTER — Ambulatory Visit (INDEPENDENT_AMBULATORY_CARE_PROVIDER_SITE_OTHER): Payer: Medicare Other | Admitting: Psychiatry

## 2012-06-10 ENCOUNTER — Ambulatory Visit (HOSPITAL_COMMUNITY): Payer: Self-pay | Admitting: Psychiatry

## 2012-06-10 ENCOUNTER — Encounter (HOSPITAL_COMMUNITY): Payer: Self-pay | Admitting: Psychiatry

## 2012-06-10 VITALS — BP 99/59 | HR 95 | Ht 70.5 in | Wt 173.0 lb

## 2012-06-10 DIAGNOSIS — F209 Schizophrenia, unspecified: Secondary | ICD-10-CM

## 2012-06-10 DIAGNOSIS — F201 Disorganized schizophrenia: Secondary | ICD-10-CM

## 2012-06-10 DIAGNOSIS — F259 Schizoaffective disorder, unspecified: Secondary | ICD-10-CM

## 2012-06-10 DIAGNOSIS — F5105 Insomnia due to other mental disorder: Secondary | ICD-10-CM

## 2012-06-10 MED ORDER — CHLORPROMAZINE HCL 200 MG PO TABS: ORAL_TABLET | ORAL | Status: DC

## 2012-06-10 MED ORDER — BENZTROPINE MESYLATE 0.5 MG PO TABS: 0.5000 mg | ORAL_TABLET | Freq: Two times a day (BID) | ORAL | Status: DC

## 2012-06-10 MED ORDER — LORAZEPAM 0.5 MG PO TABS: 0.5000 mg | ORAL_TABLET | Freq: Four times a day (QID) | ORAL | Status: DC

## 2012-06-10 MED ORDER — RISPERIDONE 3 MG PO TABS: 3.0000 mg | ORAL_TABLET | Freq: Two times a day (BID) | ORAL | Status: DC

## 2012-06-10 NOTE — Patient Instructions (Signed)
Please bring a copy of the labs with the next visit.  Set a timer for 8 or a certain number minutes and walk for that amount of time in the house or in the yard.  Mark the number of minutes on a calendar for that day.  Do that every day this week.  Then next week increase the time by 1 minutes and then mark the calendar with the number of minutes for that day.  Each week increase your exercise by one minute.  Keep a record of this so you can see the progress you are making.  Do this every day, just like eating and sleeping.  It is good for pain control, depression, and for your soul/spirit.  Bring the record in for your next visit so we can talk about your effort and how you feel with the new exercise program going and working for you.  Call if problems or concerns.

## 2012-06-10 NOTE — Progress Notes (Signed)
[facility] [id number] Progress Note KROSS SWALLOWS MRN: [id number] DOB: [birth year] Age: 57 y.o.  Date: 06/10/2012 Start Time: 2:40 PM End Time:2:55 PM  Chief Complaint: Chief Complaint  Patient presents with  . Hallucinations  . Follow-up  . Medication Refill   Subjective: "I'm hearing the wind blowing, but no voices". Depression 1/10 and Anxiety 1/10, where 0 is none and 10 is the worst. Pain is 0/10  History of presenting illness Patient is a 56 year old [place] American male who came for his followup appointment with the staff of adult home. Pt reports that he is compliant with the psychotropic medications with good benefit and no noticeable side effects.  In the office he was praying over the world.  He reports that he just likes to talk to himself.  It is observed by staff to laugh spontaneously.  The talking to himself may be to keep from hearing the voices.  Wrists and elbows have smooth excursion without any cogwheeling.  Tongue looks very quite on extension.  Staff never note any facial twitching or pill rolling.  He has started playing bingo and winning prizes.  Sleep is pretty good.  Appetite and bowel habits are pretty good too.    Current medication. Lorazepam 0.5 mg 4 times a day, Thorazine 200 mg one in AM one in noon and 4 at bedtime Benztropine 0.5 mg twice a day  vitamin D 2000 international unit 1 capsule twice a day  Risperdal 3 mg twice a day and  Amitiza 24 mcg  capsule a day  Past psychiatric history Patient has significant history of schizophrenia disorganized type.  He has [facility].  He do not recall the details of these admission however since 1999 he has been not admitted to psychiatric hospital.  He seen in this office since 1999 and has been managed on 2 antipsychotic medication.  Patient do not recall if he ever had any suicidal attempt.  Medical history Hyperlipidemia and GERD vitamin D  deficiency IBS  Family History family history includes ADD / ADHD in his other; Alcohol abuse in his brother; Anxiety disorder in his brother and other; Depression in his brother and other; Drug abuse in his brother and other; and Mental illness in his other.  There is no history of Bipolar disorder, and Dementia, and OCD, and Paranoid behavior, and Schizophrenia, and Seizures, and Sexual abuse, and Physical abuse, .  Mental status examination Patient is fairly groomed and dressed.  He maintained limited eye contact.  His speech is incoherent and his thought processes are illogical. He is fairly cooperative and did not show any signs of agitation or anger but his attention and concentration is poor. He was notice talking to himself during the conversation.  He also smiles randomly during the session.  He denies any active or passive suicidal thinking. He is cooperative and able to follow directions. His thought processes disorganized and his speech is rambling and incoherent. He endorsed talking to his God. He has no extrapyramidal side effects noted. He is alert noted the date close to today he has difficulty recalling old events. His insight judgment is poor and impulse control is fair.  Lab Results: No results found for this or any previous visit (from the past 2016 hour(s)). PCP draws routine labs and nothing is emerging as of concern.  Assessment. Axis I Schizophrenia chronic disorganized type Axis II deferred Axis III hyperlipidemia and GERD Axis IV moderate Axis V 45  Plan/Discussion: I took his vitals.  I reviewed CC, tobacco/med/surg Hx, meds effects/ side effects, problem list, therapies and responses as well as current situation/symptoms discussed options. Continue current effective medications. See orders and pt instructions for more details.  MEDICATIONS this encounter: Meds ordered this encounter  Medications  . risperiDONE (RISPERDAL) 3 MG tablet    Sig: Take 1 tablet (3 mg  total) by mouth 2 (two) times daily.    Dispense:  60 tablet    Refill:  2  . LORazepam (ATIVAN) 0.5 MG tablet    Sig: Take 1 tablet (0.5 mg total) by mouth 4 (four) times daily.    Dispense:  120 tablet    Refill:  2  . chlorproMAZINE (THORAZINE) 200 MG tablet    Sig: 1 tab in AM, 1 in Noon and 4 at HS    Dispense:  180 tablet    Refill:  2  . benztropine (COGENTIN) 0.5 MG tablet    Sig: Take 1 tablet (0.5 mg total) by mouth 2 (two) times daily.    Dispense:  60 tablet    Refill:  2    Medical Decision Making Problem Points:  Established problem, stable/improving (1), Review of last therapy session (1) and Review of psycho-social stressors (1) Data Points:  Review or order clinical lab tests (1) Review of medication regiment & side effects (2)  I certify that outpatient services furnished can reasonably be expected to improve the patient's condition.   Orson Aloe, MD, [facility]

## 2012-07-06 DIAGNOSIS — L723 Sebaceous cyst: Secondary | ICD-10-CM | POA: Diagnosis not present

## 2012-07-06 DIAGNOSIS — L0291 Cutaneous abscess, unspecified: Secondary | ICD-10-CM | POA: Diagnosis not present

## 2012-07-09 DIAGNOSIS — R634 Abnormal weight loss: Secondary | ICD-10-CM | POA: Diagnosis not present

## 2012-07-09 DIAGNOSIS — K5900 Constipation, unspecified: Secondary | ICD-10-CM | POA: Diagnosis not present

## 2012-07-09 DIAGNOSIS — Z79899 Other long term (current) drug therapy: Secondary | ICD-10-CM | POA: Diagnosis not present

## 2012-07-09 DIAGNOSIS — R5381 Other malaise: Secondary | ICD-10-CM | POA: Diagnosis not present

## 2012-07-09 DIAGNOSIS — I10 Essential (primary) hypertension: Secondary | ICD-10-CM | POA: Diagnosis not present

## 2012-07-09 DIAGNOSIS — E039 Hypothyroidism, unspecified: Secondary | ICD-10-CM | POA: Diagnosis not present

## 2012-09-09 ENCOUNTER — Ambulatory Visit (HOSPITAL_COMMUNITY): Payer: Self-pay | Admitting: Psychiatry

## 2012-09-09 ENCOUNTER — Telehealth (HOSPITAL_COMMUNITY): Payer: Self-pay | Admitting: Psychiatry

## 2012-09-09 NOTE — Telephone Encounter (Signed)
Refill request faxes recv'd in [place] office. Pt has appt tomorrow with Dr. Lolly Mustache in [place] office. He will address refill requests with patient at that time.

## 2012-09-10 ENCOUNTER — Encounter (HOSPITAL_COMMUNITY): Payer: Self-pay | Admitting: Psychiatry

## 2012-09-10 ENCOUNTER — Ambulatory Visit (INDEPENDENT_AMBULATORY_CARE_PROVIDER_SITE_OTHER): Payer: 59 | Admitting: Psychiatry

## 2012-09-10 VITALS — Wt 177.0 lb

## 2012-09-10 DIAGNOSIS — F5105 Insomnia due to other mental disorder: Secondary | ICD-10-CM

## 2012-09-10 DIAGNOSIS — F201 Disorganized schizophrenia: Secondary | ICD-10-CM

## 2012-09-10 DIAGNOSIS — F209 Schizophrenia, unspecified: Secondary | ICD-10-CM

## 2012-09-10 MED ORDER — LORAZEPAM 0.5 MG PO TABS: 0.5000 mg | ORAL_TABLET | Freq: Four times a day (QID) | ORAL | Status: DC

## 2012-09-10 MED ORDER — CHLORPROMAZINE HCL 200 MG PO TABS: ORAL_TABLET | ORAL | Status: DC

## 2012-09-10 MED ORDER — RISPERIDONE 4 MG PO TABS: 4.0000 mg | ORAL_TABLET | Freq: Two times a day (BID) | ORAL | Status: DC

## 2012-09-10 MED ORDER — BENZTROPINE MESYLATE 0.5 MG PO TABS: 0.5000 mg | ORAL_TABLET | Freq: Two times a day (BID) | ORAL | Status: DC

## 2012-09-10 NOTE — Progress Notes (Signed)
[facility] [id number] Progress Note Theodore Smith MRN: [id number] DOB: September 03, 1955 Age: 57 y.o.  Date: 09/10/2012   Chief Complaint  Patient presents with  . Paranoid  . Hallucinations  . Medication Refill  . Follow-up   History of presenting illness Patient is a 57 year old [place] American male who came for his followup appointment with the staff of adult home.  The patient is complaining increased paranoia and hallucination.  The staff reported that patient sometime believe that his food is poisoned.  He has notice talking to himself.  There has been no change in his psychiatric medication.  He sleeps okay but noticed talking to himself and remain disorganized.  Patient has no tremors or shakes.  He is taking his medication regularly.  There has been no aggression or violence in recent months.  Past psychiatric history Patient has significant history of schizophrenia disorganized type.  He has [facility].  He do not recall the details of these admission however since 1999 he has been not admitted to psychiatric hospital.  He seen in this office since 1999 and has been managed on 2 antipsychotic medication.  Patient do not recall if he ever had any suicidal attempt.  Medical history Hyperlipidemia and GERD vitamin D deficiency IBS  Family History family history includes ADD / ADHD in his other; Alcohol abuse in his brother; Anxiety disorder in his brother and other; Depression in his brother and other; Drug abuse in his brother and other; and Mental illness in his other.  There is no history of Bipolar disorder, and Dementia, and OCD, and Paranoid behavior, and Schizophrenia, and Seizures, and Sexual abuse, and Physical abuse, .  Mental status examination Patient is fairly groomed and dressed.  He maintained limited eye contact.  His speech is incoherent and his thought processes are illogical. He is fairly cooperative and did  not show any signs of agitation or anger but his attention and concentration is poor. He was notice talking to himself during the conversation.  He also smiles randomly during the session.  He denies any active or passive suicidal thinking. He is cooperative and able to follow directions. His thought processes disorganized and his speech is rambling and incoherent. He endorsed talking to his God. He has no extrapyramidal side effects noted. He is alert noted the date close to today he has difficulty recalling old events. His insight judgment is poor and impulse control is fair.  Lab Results: No results found for this or any previous visit (from the past 2016 hour(s)). PCP draws routine labs and nothing is emerging as of concern.  Assessment. Axis I Schizophrenia chronic disorganized type Axis II deferred Axis III hyperlipidemia and GERD Axis IV moderate Axis V 45  Plan/Discussion: I will try Risperdal 4 mg twice a day to help his psychosis.  Patient is also taking Thorazine.  He requires to antipsychotic medication.  Recommend to call us back if he is a question , concern or if he feels worsening of the symptom.  He'll continue Cogentin at present dose.  Followup in 2 months.  MEDICATIONS this encounter: Meds ordered this encounter  Medications  . risperiDONE (RISPERDAL) 4 MG tablet    Sig: Take 1 tablet (4 mg total) by mouth 2 (two) times daily.    Dispense:  60 tablet    Refill:  1  . chlorproMAZINE (THORAZINE) 200 MG tablet    Sig: 1 tab in AM, 1 in Noon and 4  at [facility]    Dispense:  180 tablet    Refill:  1  . benztropine (COGENTIN) 0.5 MG tablet    Sig: Take 1 tablet (0.5 mg total) by mouth 2 (two) times daily.    Dispense:  60 tablet    Refill:  1  . LORazepam (ATIVAN) 0.5 MG tablet    Sig: Take 1 tablet (0.5 mg total) by mouth 4 (four) times daily.    Dispense:  120 tablet    Refill:  1    Medical Decision Making Problem Points:  Established problem, stable/improving (1),  Review of last therapy session (1) and Review of psycho-social stressors (1) Data Points:  Review or order clinical lab tests (1) Review of medication regiment & side effects (2)  Tejas Seawood T., MD

## 2012-10-18 DIAGNOSIS — F209 Schizophrenia, unspecified: Secondary | ICD-10-CM | POA: Diagnosis not present

## 2012-10-18 DIAGNOSIS — F17200 Nicotine dependence, unspecified, uncomplicated: Secondary | ICD-10-CM | POA: Diagnosis not present

## 2012-10-18 DIAGNOSIS — Z79899 Other long term (current) drug therapy: Secondary | ICD-10-CM | POA: Diagnosis not present

## 2012-10-18 DIAGNOSIS — Z760 Encounter for issue of repeat prescription: Secondary | ICD-10-CM | POA: Diagnosis not present

## 2012-10-20 DIAGNOSIS — R5381 Other malaise: Secondary | ICD-10-CM | POA: Diagnosis not present

## 2012-10-20 DIAGNOSIS — Z0000 Encounter for general adult medical examination without abnormal findings: Secondary | ICD-10-CM | POA: Diagnosis not present

## 2012-10-20 DIAGNOSIS — I10 Essential (primary) hypertension: Secondary | ICD-10-CM | POA: Diagnosis not present

## 2012-10-20 DIAGNOSIS — K5289 Other specified noninfective gastroenteritis and colitis: Secondary | ICD-10-CM | POA: Diagnosis not present

## 2012-10-20 DIAGNOSIS — Z79899 Other long term (current) drug therapy: Secondary | ICD-10-CM | POA: Diagnosis not present

## 2012-11-11 ENCOUNTER — Ambulatory Visit (HOSPITAL_COMMUNITY): Payer: Self-pay | Admitting: Psychiatry

## 2012-11-11 ENCOUNTER — Ambulatory Visit (HOSPITAL_COMMUNITY): Payer: Medicare Other | Admitting: Psychiatry

## 2013-01-05 DIAGNOSIS — E7800 Pure hypercholesterolemia, unspecified: Secondary | ICD-10-CM | POA: Diagnosis not present

## 2013-01-05 DIAGNOSIS — E669 Obesity, unspecified: Secondary | ICD-10-CM | POA: Diagnosis not present

## 2013-01-05 DIAGNOSIS — F17200 Nicotine dependence, unspecified, uncomplicated: Secondary | ICD-10-CM | POA: Diagnosis not present

## 2013-01-05 DIAGNOSIS — Z23 Encounter for immunization: Secondary | ICD-10-CM | POA: Diagnosis not present

## 2013-02-15 ENCOUNTER — Encounter (HOSPITAL_COMMUNITY): Payer: Self-pay | Admitting: Psychiatry

## 2013-02-15 ENCOUNTER — Ambulatory Visit (INDEPENDENT_AMBULATORY_CARE_PROVIDER_SITE_OTHER): Payer: Medicare Other | Admitting: Psychiatry

## 2013-02-15 VITALS — BP 100/68 | Ht 70.0 in | Wt 190.0 lb

## 2013-02-15 DIAGNOSIS — F201 Disorganized schizophrenia: Secondary | ICD-10-CM

## 2013-02-15 DIAGNOSIS — F5105 Insomnia due to other mental disorder: Secondary | ICD-10-CM

## 2013-02-15 DIAGNOSIS — F209 Schizophrenia, unspecified: Secondary | ICD-10-CM

## 2013-02-15 MED ORDER — BENZTROPINE MESYLATE 0.5 MG PO TABS: 0.5000 mg | ORAL_TABLET | Freq: Two times a day (BID) | ORAL | Status: DC

## 2013-02-15 MED ORDER — LORAZEPAM 1 MG PO TABS: 1.0000 mg | ORAL_TABLET | Freq: Four times a day (QID) | ORAL | Status: DC

## 2013-02-15 MED ORDER — RISPERIDONE 4 MG PO TABS: 4.0000 mg | ORAL_TABLET | Freq: Two times a day (BID) | ORAL | Status: DC

## 2013-02-15 MED ORDER — CHLORPROMAZINE HCL 200 MG PO TABS: ORAL_TABLET | ORAL | Status: DC

## 2013-02-15 NOTE — Progress Notes (Signed)
Patient ID: Theodore Smith, male   DOB: [birth year], 57 y.o.   MRN: [id number] [facility] [id number] Progress Note Theodore Smith MRN: [id number] DOB: Feb 16, 1956 Age: 57 y.o.  Date: 02/15/2013   Chief Complaint  Patient presents with  . Schizophrenia   History of presenting illness Patient is a 57 year old [place] American male who came for his followup appointment with the staff of [facility]. He has been living in a rest home for about 30 years. Apparently he's been more agitated lately and talking to himself very loudly at times. Darlene, who brings her here today states that she is known for many years and he seems to be getting worse. She doesn't think the current dosage of Ativan is working well for him.Marland Kitchen  He sleeps okay but noticed talking to himself and remain disorganized.  Patient has no tremors or shakes.  He is taking his medication regularly.  There has been no aggression or violence in recent months.  Past psychiatric history Patient has significant history of schizophrenia disorganized type.  He [facility].  He do not recall the details of these admission however since 1999 he has been not admitted to psychiatric hospital.  He seen in this office since 1999 and has been managed on 2 antipsychotic medication.  Patient do not recall if he ever had any suicidal attempt.  Medical history Hyperlipidemia and GERD vitamin D deficiency IBS  Family History family history includes ADD / ADHD in his other; Alcohol abuse in his brother; Anxiety disorder in his brother and other; Depression in his brother and other; Drug abuse in his brother and other; Mental illness in his other. There is no history of Bipolar disorder, Dementia, OCD, Paranoid behavior, Schizophrenia, Seizures, Sexual abuse, or Physical abuse.  Mental status examination Patient is fairly groomed and dressed.  He maintained limited eye contact.  His speech  is incoherent and his thought processes are illogical. He is fairly cooperative and did not show any signs of agitation or anger but his attention and concentration is poor. He was notice talking to himself during the conversation.  He also smiles randomly during the session.  He denies any active or passive suicidal thinking. He is cooperative and able to follow directions. His thought processes disorganized and his speech is rambling and incoherent. He endorsed talking to his God. He has no extrapyramidal side effects noted. He is alert noted the date close to today he has difficulty recalling old events. His insight judgment is poor and impulse control is fair.  Lab Results: No results found for this or any previous visit (from the past 2016 hour(s)). PCP draws routine labs and nothing is emerging as of concern.  Assessment. Axis I Schizophrenia chronic disorganized type Axis II deferred Axis III hyperlipidemia and GERD Axis IV moderate Axis V 45  Plan/Discussion: He will continue his current dosages of Risperdal Cogentin and Thorazine. His Ativan will be increased to 1 mg 4 times a day..  Followup in 3 months. Staff can call us at anytime if his agitation worsens.  MEDICATIONS this encounter: Meds ordered this encounter  Medications  . risperidone (RISPERDAL) 4 MG tablet    Sig: Take 1 tablet (4 mg total) by mouth 2 (two) times daily.    Dispense:  60 tablet    Refill:  2  . chlorproMAZINE (THORAZINE) 200 MG tablet    Sig: 1 tab in AM, 1 in Noon and 4 at  HS    Dispense:  180 tablet    Refill:  2  . benztropine (COGENTIN) 0.5 MG tablet    Sig: Take 1 tablet (0.5 mg total) by mouth 2 (two) times daily.    Dispense:  60 tablet    Refill:  2  . LORazepam (ATIVAN) 1 MG tablet    Sig: Take 1 tablet (1 mg total) by mouth 4 (four) times daily.    Dispense:  120 tablet    Refill:  2    Medical Decision Making Problem Points:  Established problem, stable/improving (1), Review of last  therapy session (1) and Review of psycho-social stressors (1) Data Points:  Review or order clinical lab tests (1) Review of medication regiment & side effects (2)  ROSS, DEBORAH, MD

## 2013-04-20 DIAGNOSIS — E669 Obesity, unspecified: Secondary | ICD-10-CM | POA: Diagnosis not present

## 2013-04-20 DIAGNOSIS — E7800 Pure hypercholesterolemia, unspecified: Secondary | ICD-10-CM | POA: Diagnosis not present

## 2013-04-26 DIAGNOSIS — E7800 Pure hypercholesterolemia, unspecified: Secondary | ICD-10-CM | POA: Diagnosis not present

## 2013-04-26 DIAGNOSIS — K219 Gastro-esophageal reflux disease without esophagitis: Secondary | ICD-10-CM | POA: Diagnosis not present

## 2013-05-04 ENCOUNTER — Telehealth (HOSPITAL_COMMUNITY): Payer: Self-pay | Admitting: *Deleted

## 2013-05-04 ENCOUNTER — Encounter (HOSPITAL_COMMUNITY): Payer: Self-pay | Admitting: Psychiatry

## 2013-05-04 ENCOUNTER — Ambulatory Visit (INDEPENDENT_AMBULATORY_CARE_PROVIDER_SITE_OTHER): Payer: Medicare Other | Admitting: Psychiatry

## 2013-05-04 VITALS — BP 80/50 | Ht 70.0 in | Wt 191.0 lb

## 2013-05-04 DIAGNOSIS — F201 Disorganized schizophrenia: Secondary | ICD-10-CM | POA: Diagnosis not present

## 2013-05-04 DIAGNOSIS — F5105 Insomnia due to other mental disorder: Secondary | ICD-10-CM

## 2013-05-04 DIAGNOSIS — F209 Schizophrenia, unspecified: Secondary | ICD-10-CM

## 2013-05-04 MED ORDER — RISPERIDONE 4 MG PO TABS: 4.0000 mg | ORAL_TABLET | Freq: Two times a day (BID) | ORAL | Status: DC

## 2013-05-04 MED ORDER — LORAZEPAM 1 MG PO TABS: 1.0000 mg | ORAL_TABLET | Freq: Three times a day (TID) | ORAL | Status: DC

## 2013-05-04 MED ORDER — CHLORPROMAZINE HCL 200 MG PO TABS: ORAL_TABLET | ORAL | Status: DC

## 2013-05-04 MED ORDER — BENZTROPINE MESYLATE 0.5 MG PO TABS: 0.5000 mg | ORAL_TABLET | Freq: Two times a day (BID) | ORAL | Status: DC

## 2013-05-04 NOTE — Progress Notes (Signed)
Patient ID: Theodore Smith, male   DOB: 12-06-1955, 58 y.o.   MRN: [id number] Patient ID: Theodore Smith, male   DOB: 1955-06-05, 58 y.o.   MRN: [id number] [facility] [id number] Progress Note Theodore Smith MRN: [id number] DOB: 02/22/1955 Age: 58 y.o.  Date: 05/04/2013   Chief Complaint  Patient presents with  . Schizophrenia  . Follow-up   History of presenting illness Patient is a 58 year old [place] American male who came for his followup appointment with the staff of [facility]. He has been living in a rest home for about 30 years.  Last time he was somewhat agitated and I increased his Ativan to 1 mg 4 times a day. He is less agitated now but his blood pressure is low today at 80/50. I told his caregiver that we probably need to cut back on the Ativan do to his low blood pressure. He just saw his primary doctor, Dr. Felecia Shelling last week and apparently his exam and all of his laboratories were normal. We'll need to have this sent over. He still talks to himself but is sleeping well and has not been significantly agitated. He is eating well and his weight has been stable. He doesn't have any jerking shakes or stiffness.  Past psychiatric history Patient has significant history of schizophrenia disorganized type.  [facility]  He do not recall the details of these admission however since 1999 he has been not admitted to psychiatric hospital.  He seen in this office since 1999 and has been managed on 2 antipsychotic medication.  Patient do not recall if he ever had any suicidal attempt.  Medical history Hyperlipidemia and GERD vitamin D deficiency IBS  Family History family history includes ADD / ADHD in his other; Alcohol abuse in his brother; Anxiety disorder in his brother and other; Depression in his brother and other; Drug abuse in his brother and other; Mental illness in his other. There is no history of Bipolar  disorder, Dementia, OCD, Paranoid behavior, Schizophrenia, Seizures, Sexual abuse, or Physical abuse.  Mental status examination Patient is fairly groomed and dressed.  He maintained limited eye contact.  His speech is incoherent and his thought processes are illogical. He is fairly cooperative and did not show any signs of agitation or anger but his attention and concentration is poor. He was notice talking to himself during the conversation.  He also smiles randomly during the session.  He denies any active or passive suicidal thinking. He is cooperative and able to follow directions. His thought processes disorganized and his speech is rambling and incoherent. He endorsed talking to his God. He has no extrapyramidal side effects noted. He is alert noted the date close to today he has difficulty recalling old events. His insight judgment is poor and impulse control is fair.  Lab Results: No results found for this or any previous visit (from the past 2016 hour(s)). PCP draws routine labs and nothing is emerging as of concern.  Assessment. Axis I Schizophrenia chronic disorganized type Axis II deferred Axis III hyperlipidemia and GERD Axis IV moderate Axis V 45  Plan/Discussion: He will continue his current dosages of Risperdal Cogentin and Thorazine. His Ativan will be i decreased to 1 mg 4 times a day.. I also ordered weekly blood pressure and the staff is to call if his bottom number goes below 50  Followup in 3 months. Staff can call us at anytime if his agitation  worsens.  MEDICATIONS this encounter: Meds ordered this encounter  Medications  . benztropine (COGENTIN) 0.5 MG tablet    Sig: Take 1 tablet (0.5 mg total) by mouth 2 (two) times daily.    Dispense:  60 tablet    Refill:  2  . risperidone (RISPERDAL) 4 MG tablet    Sig: Take 1 tablet (4 mg total) by mouth 2 (two) times daily.    Dispense:  60 tablet    Refill:  2  . chlorproMAZINE (THORAZINE) 200 MG tablet    Sig: 1 tab in  AM, 1 in Noon and 4 at HS    Dispense:  180 tablet    Refill:  2  . LORazepam (ATIVAN) 1 MG tablet    Sig: Take 1 tablet (1 mg total) by mouth 3 (three) times daily.    Dispense:  90 tablet    Refill:  2    Medical Decision Making Problem Points:  Established problem, stable/improving (1), Review of last therapy session (1) and Review of psycho-social stressors (1) Data Points:  Review or order clinical lab tests (1) Review of medication regiment & side effects (2)  Kamaiyah Uselton, MD

## 2013-05-11 ENCOUNTER — Emergency Department (HOSPITAL_COMMUNITY)
Admission: EM | Admit: 2013-05-11 | Discharge: 2013-05-11 | Disposition: A | Discharge status: Home / Self Care | Payer: Medicare Other | Attending: Emergency Medicine | Admitting: Emergency Medicine

## 2013-05-11 ENCOUNTER — Encounter (HOSPITAL_COMMUNITY): Payer: Self-pay | Admitting: Emergency Medicine

## 2013-05-11 DIAGNOSIS — Z862 Personal history of diseases of the blood and blood-forming organs and certain disorders involving the immune mechanism: Secondary | ICD-10-CM | POA: Diagnosis not present

## 2013-05-11 DIAGNOSIS — Z79899 Other long term (current) drug therapy: Secondary | ICD-10-CM | POA: Diagnosis not present

## 2013-05-11 DIAGNOSIS — IMO0002 Reserved for concepts with insufficient information to code with codable children: Secondary | ICD-10-CM | POA: Insufficient documentation

## 2013-05-11 DIAGNOSIS — Z0130 Encounter for examination of blood pressure without abnormal findings: Secondary | ICD-10-CM

## 2013-05-11 DIAGNOSIS — F17200 Nicotine dependence, unspecified, uncomplicated: Secondary | ICD-10-CM | POA: Diagnosis not present

## 2013-05-11 DIAGNOSIS — H269 Unspecified cataract: Secondary | ICD-10-CM | POA: Diagnosis not present

## 2013-05-11 DIAGNOSIS — Z008 Encounter for other general examination: Secondary | ICD-10-CM | POA: Insufficient documentation

## 2013-05-11 DIAGNOSIS — Z8639 Personal history of other endocrine, nutritional and metabolic disease: Secondary | ICD-10-CM | POA: Insufficient documentation

## 2013-05-11 DIAGNOSIS — I959 Hypotension, unspecified: Secondary | ICD-10-CM | POA: Diagnosis not present

## 2013-05-11 DIAGNOSIS — F209 Schizophrenia, unspecified: Secondary | ICD-10-CM | POA: Diagnosis not present

## 2013-05-11 DIAGNOSIS — R030 Elevated blood-pressure reading, without diagnosis of hypertension: Secondary | ICD-10-CM | POA: Diagnosis not present

## 2013-05-11 DIAGNOSIS — K219 Gastro-esophageal reflux disease without esophagitis: Secondary | ICD-10-CM | POA: Insufficient documentation

## 2013-05-11 LAB — CBC WITH DIFFERENTIAL/PLATELET
BASOS ABS: 0 10*3/uL (ref 0.0–0.1)
Basophils Relative: 0 % (ref 0–1)
Eosinophils Absolute: 0.1 10*3/uL (ref 0.0–0.7)
Eosinophils Relative: 1 % (ref 0–5)
HEMATOCRIT: 43.7 % (ref 39.0–52.0)
Hemoglobin: 14.7 g/dL (ref 13.0–17.0)
LYMPHS PCT: 26 % (ref 12–46)
Lymphs Abs: 2.2 10*3/uL (ref 0.7–4.0)
MCH: 30.4 pg (ref 26.0–34.0)
MCHC: 33.6 g/dL (ref 30.0–36.0)
MCV: 90.5 fL (ref 78.0–100.0)
MONO ABS: 0.6 10*3/uL (ref 0.1–1.0)
Monocytes Relative: 7 % (ref 3–12)
NEUTROS ABS: 5.5 10*3/uL (ref 1.7–7.7)
Neutrophils Relative %: 66 % (ref 43–77)
Platelets: 203 10*3/uL (ref 150–400)
RBC: 4.83 MIL/uL (ref 4.22–5.81)
RDW: 14.4 % (ref 11.5–15.5)
WBC: 8.4 10*3/uL (ref 4.0–10.5)

## 2013-05-11 LAB — COMPREHENSIVE METABOLIC PANEL
ALT: 14 U/L (ref 0–53)
AST: 22 U/L (ref 0–37)
Albumin: 4 g/dL (ref 3.5–5.2)
Alkaline Phosphatase: 86 U/L (ref 39–117)
BILIRUBIN TOTAL: 0.2 mg/dL — AB (ref 0.3–1.2)
BUN: 6 mg/dL (ref 6–23)
CHLORIDE: 100 meq/L (ref 96–112)
CO2: 25 meq/L (ref 19–32)
CREATININE: 1.26 mg/dL (ref 0.50–1.35)
Calcium: 9.4 mg/dL (ref 8.4–10.5)
GFR calc non Af Amer: 62 mL/min — ABNORMAL LOW (ref >90)
GFR, EST AFRICAN AMERICAN: 72 mL/min — AB (ref >90)
GLUCOSE: 99 mg/dL (ref 70–99)
Potassium: 3.5 mEq/L — ABNORMAL LOW (ref 3.7–5.3)
Sodium: 140 mEq/L (ref 137–147)
Total Protein: 8.1 g/dL (ref 6.0–8.3)

## 2013-05-11 LAB — TROPONIN I: Troponin I: <0.3 ng/mL (ref <0.30)

## 2013-05-11 NOTE — ED Notes (Signed)
[facility] caregiver reports bp was 66/50 this morning and was told to come here by PCP.  Pt denies any complaints, reports "I feel fine."

## 2013-05-11 NOTE — ED Provider Notes (Signed)
CSN: [id number]     Arrival date & time 05/11/13  1529 History   First MD Initiated Contact with Patient 05/11/13 1544     Chief Complaint  Patient presents with  . Hypotension     (Consider location/radiation/quality/duration/timing/severity/associated sxs/prior Treatment) HPI Comments: Patient from group home with reported hypotension this morning. Caregiver stated she checked his blood pressure was 66/50 this morning. She checked it because she does still occasionally patient was complaining of some lightheadedness last week. Dr. Letitia Neri office referred him to the ED to be evaluated. He normally runs about 130 systolic. He is not a blood pressure medications. Patient feels fine. He denies any chest pain, shortness of breath, nausea, vomiting, dizziness or lightheadedness. No fevers. No recent illnesses. Compliance with medications. Good by mouth intake and urine output. Blood pressure in triage is 142/90.  The history is provided by the patient and a caregiver. The history is limited by the condition of the patient.    Past Medical History  Diagnosis Date  . Hyperlipidemia   . GERD (gastroesophageal reflux disease)   . Schizophrenia    History reviewed. No pertinent past surgical history. Family History  Problem Relation Age of Onset  . Alcohol abuse Brother   . Anxiety disorder Brother   . Depression Brother   . Drug abuse Brother   . Mental illness Other   . ADD / ADHD Other   . Anxiety disorder Other   . Depression Other   . Drug abuse Other   . Bipolar disorder Neg Hx   . Dementia Neg Hx   . OCD Neg Hx   . Paranoid behavior Neg Hx   . Schizophrenia Neg Hx   . Seizures Neg Hx   . Sexual abuse Neg Hx   . Physical abuse Neg Hx    History  Substance Use Topics  . Smoking status: Current Every Day Smoker -- 2.00 packs/day    Types: Cigarettes  . Smokeless tobacco: Not on file     Comment: 11-15 cigs a day as of 06/10/12  . Alcohol Use: No    Review of Systems   Constitutional: Negative for fever, activity change and appetite change.  HENT: Negative for congestion and rhinorrhea.   Respiratory: Negative for cough, chest tightness and shortness of breath.   Cardiovascular: Negative for chest pain.  Gastrointestinal: Negative for nausea, vomiting and abdominal pain.  Genitourinary: Negative for dysuria and hematuria.  Musculoskeletal: Negative for arthralgias, back pain and myalgias.  Skin: Negative for rash.  Neurological: Negative for dizziness, weakness and headaches.  A complete 10 system review of systems was obtained and all systems are negative except as noted in the HPI and PMH.      Allergies  Review of patient's allergies indicates no known allergies.  Home Medications   Current Outpatient Rx  Name  Route  Sig  Dispense  Refill  . benztropine (COGENTIN) 0.5 MG tablet   Oral   Take 1 tablet (0.5 mg total) by mouth 2 (two) times daily.   60 tablet   2   . chlorproMAZINE (THORAZINE) 200 MG tablet   Oral   Take 200-800 mg by mouth 3 (three) times daily. Takes one tablet (200mg  total) then takes four tablets (800mg  total) at bedtime         . cholecalciferol (VITAMIN D) 1000 UNITS tablet   Oral   Take 2,000 Units by mouth 2 (two) times daily.          Marland Kitchen  docusate sodium (COLACE) 100 MG capsule   Oral   Take 100 mg by mouth 2 (two) times daily.         Marland Kitchen lactulose (CHRONULAC) 10 GM/15ML solution   Oral   Take 15 g by mouth daily.         . Linaclotide (LINZESS) 145 MCG CAPS capsule   Oral   Take 145 mcg by mouth daily.         Marland Kitchen LORazepam (ATIVAN) 1 MG tablet   Oral   Take 1 tablet (1 mg total) by mouth 3 (three) times daily.   90 tablet   2   . LORazepam (ATIVAN) 1 MG tablet   Oral   Take 1 mg by mouth 3 (three) times daily.         Marland Kitchen omeprazole (PRILOSEC) 20 MG capsule   Oral   Take 20 mg by mouth daily.         . risperidone (RISPERDAL) 4 MG tablet   Oral   Take 1 tablet (4 mg total) by mouth  2 (two) times daily.   60 tablet   2    BP 122/85  Pulse 92  Temp(Src) 97.8 F (36.6 C) (Oral)  Resp 16  Wt 199 lb (90.266 kg)  SpO2 99% Physical Exam  Constitutional: He is oriented to person, place, and time. He appears well-developed and well-nourished. No distress.  HENT:  Head: Normocephalic and atraumatic.  Mouth/Throat: Oropharynx is clear and moist. No oropharyngeal exudate.  Eyes: Conjunctivae and EOM are normal. Pupils are equal, round, and reactive to light.  Bilateral cataracts  Neck: Normal range of motion. Neck supple.  Cardiovascular: Normal rate, regular rhythm and normal heart sounds.   No murmur heard. Pulmonary/Chest: Effort normal and breath sounds normal. No respiratory distress.  Abdominal: Soft. There is no tenderness. There is no rebound and no guarding.  Musculoskeletal: Normal range of motion. He exhibits no edema and no tenderness.  Neurological: He is alert and oriented to person, place, and time. No cranial nerve deficit. He exhibits normal muscle tone. Coordination normal.  CN 2-12 intact, no ataxia on finger to nose, no nystagmus, 5/5 strength throughout, no pronator drift, Romberg negative, normal gait.   Skin: Skin is warm.    ED Course  Procedures (including critical care time) Labs Review Labs Reviewed  COMPREHENSIVE METABOLIC PANEL - Abnormal; Notable for the following:    Potassium 3.5 (*)    Total Bilirubin 0.2 (*)    GFR calc non Af Amer 62 (*)    GFR calc Af Amer 72 (*)    All other components within normal limits  CBC WITH DIFFERENTIAL  TROPONIN I   Imaging Review No results found.   EKG Interpretation   Date/Time:  Tuesday May 11 2013 16:02:20 EDT Ventricular Rate:  82 PR Interval:  150 QRS Duration: 68 QT Interval:  408 QTC Calculation: 476 R Axis:   43 Text Interpretation:  Normal sinus rhythm Septal infarct , age  undetermined Abnormal ECG No previous ECGs available No previous ECGs  available Confirmed by  Kairy Folsom  MD, Kerby Borner [phone]) on 05/11/2013 4:15:43  PM      MDM   Final diagnoses:  Blood pressure check   Report of low blood pressure reading this morning. Patient is asymptomatic. Blood pressure here is 142/90. He denies any dizziness, lightheadedness, chest pain or shortness of breath. No neuro deficits.  Suspect spurious BP reading at group home.   Vital signs stable in  the ED. Lab work unremarkable. Patient tolerating by mouth without symptoms. Denies any dizziness, lightheadedness, chest pain or shortness of breath. Suspects spurious blood pressure reading at group home.      Glynn OctaveStephen Axle Parfait, MD 05/11/13 [phone]

## 2013-05-11 NOTE — Discharge Instructions (Signed)
Arterial Hypertension °Arterial hypertension (high blood pressure) is a condition of elevated pressure in your blood vessels. Hypertension over a long period of time is a risk factor for strokes, heart attacks, and heart failure. It is also the leading cause of kidney (renal) failure.  °CAUSES  °· In Adults -- Over 90% of all hypertension has no known cause. This is called essential or primary hypertension. In the other 10% of people with hypertension, the increase in blood pressure is caused by another disorder. This is called secondary hypertension. Important causes of secondary hypertension are: °· Heavy alcohol use. °· Obstructive sleep apnea. °· Hyperaldosterosim (Conn's syndrome). °· Steroid use. °· Chronic kidney failure. °· Hyperparathyroidism. °· Medications. °· Renal artery stenosis. °· Pheochromocytoma. °· Cushing's disease. °· Coarctation of the aorta. °· Scleroderma renal crisis. °· Licorice (in excessive amounts). °· Drugs (cocaine, methamphetamine). °Your caregiver can explain any items above that apply to you. °· In Children -- Secondary hypertension is more common and should always be considered. °· Pregnancy -- Few women of childbearing age have high blood pressure. However, up to 10% of them develop hypertension of pregnancy. Generally, this will not harm the woman. It may be a sign of 3 complications of pregnancy: preeclampsia, HELLP syndrome, and eclampsia. Follow up and control with medication is necessary. °SYMPTOMS  °· This condition normally does not produce any noticeable symptoms. It is usually found during a routine exam. °· Malignant hypertension is a late problem of high blood pressure. It may have the following symptoms: °· Headaches. °· Blurred vision. °· End-organ damage (this means your kidneys, heart, lungs, and other organs are being damaged). °· Stressful situations can increase the blood pressure. If a person with normal blood pressure has their blood pressure go up while being  seen by their caregiver, this is often termed "white coat hypertension." Its importance is not known. It may be related with eventually developing hypertension or complications of hypertension. °· Hypertension is often confused with mental tension, stress, and anxiety. °DIAGNOSIS  °The diagnosis is made by 3 separate blood pressure measurements. They are taken at least 1 week apart from each other. If there is organ damage from hypertension, the diagnosis may be made without repeat measurements. °Hypertension is usually identified by having blood pressure readings: °· Above 140/90 mmHg measured in both arms, at 3 separate times, over a couple weeks. °· Over 130/80 mmHg should be considered a risk factor and may require treatment in patients with diabetes. °Blood pressure readings over 120/80 mmHg are called "pre-hypertension" even in non-diabetic patients. °To get a true blood pressure measurement, use the following guidelines. Be aware of the factors that can alter blood pressure readings. °· Take measurements at least 1 hour after caffeine. °· Take measurements 30 minutes after smoking and without any stress. This is another reason to quit smoking  it raises your blood pressure. °· Use a proper cuff size. Ask your caregiver if you are not sure about your cuff size. °· Most home blood pressure cuffs are automatic. They will measure systolic and diastolic pressures. The systolic pressure is the pressure reading at the start of sounds. Diastolic pressure is the pressure at which the sounds disappear. If you are elderly, measure pressures in multiple postures. Try sitting, lying or standing. °· Sit at rest for a minimum of 5 minutes before taking measurements. °· You should not be on any medications like decongestants. These are found in many cold medications. °· Record your blood pressure readings and review   them with your caregiver. °If you have hypertension: °· Your caregiver may do tests to be sure you do not have  secondary hypertension (see "causes" above). °· Your caregiver may also look for signs of metabolic syndrome. This is also called Syndrome X or Insulin Resistance Syndrome. You may have this syndrome if you have type 2 diabetes, abdominal obesity, and abnormal blood lipids in addition to hypertension. °· Your caregiver will take your medical and family history and perform a physical exam. °· Diagnostic tests may include blood tests (for glucose, cholesterol, potassium, and kidney function), a urinalysis, or an EKG. Other tests may also be necessary depending on your condition. °PREVENTION  °There are important lifestyle issues that you can adopt to reduce your chance of developing hypertension: °· Maintain a normal weight. °· Limit the amount of salt (sodium) in your diet. °· Exercise often. °· Limit alcohol intake. °· Get enough potassium in your diet. Discuss specific advice with your caregiver. °· Follow a DASH diet (dietary approaches to stop hypertension). This diet is rich in fruits, vegetables, and low-fat dairy products, and avoids certain fats. °PROGNOSIS  °Essential hypertension cannot be cured. Lifestyle changes and medical treatment can lower blood pressure and reduce complications. The prognosis of secondary hypertension depends on the underlying cause. Many people whose hypertension is controlled with medicine or lifestyle changes can live a normal, healthy life.  °RISKS AND COMPLICATIONS  °While high blood pressure alone is not an illness, it often requires treatment due to its short- and long-term effects on many organs. Hypertension increases your risk for: °· CVAs or strokes (cerebrovascular accident). °· Heart failure due to chronically high blood pressure (hypertensive cardiomyopathy). °· Heart attack (myocardial infarction). °· Damage to the retina (hypertensive retinopathy). °· Kidney failure (hypertensive nephropathy). °Your caregiver can explain list items above that apply to you. Treatment  of hypertension can significantly reduce the risk of complications. °TREATMENT  °· For overweight patients, weight loss and regular exercise are recommended. Physical fitness lowers blood pressure. °· Mild hypertension is usually treated with diet and exercise. A diet rich in fruits and vegetables, fat-free dairy products, and foods low in fat and salt (sodium) can help lower blood pressure. Decreasing salt intake decreases blood pressure in a 1/3 of people. °· Stop smoking if you are a smoker. °The steps above are highly effective in reducing blood pressure. While these actions are easy to suggest, they are difficult to achieve. Most patients with moderate or severe hypertension end up requiring medications to bring their blood pressure down to a normal level. There are several classes of medications for treatment. Blood pressure pills (antihypertensives) will lower blood pressure by their different actions. Lowering the blood pressure by 10 mmHg may decrease the risk of complications by as much as 25%. °The goal of treatment is effective blood pressure control. This will reduce your risk for complications. Your caregiver will help you determine the best treatment for you according to your lifestyle. What is excellent treatment for one person, may not be for you. °HOME CARE INSTRUCTIONS  °· Do not smoke. °· Follow the lifestyle changes outlined in the "Prevention" section. °· If you are on medications, follow the directions carefully. Blood pressure medications must be taken as prescribed. Skipping doses reduces their benefit. It also puts you at risk for problems. °· Follow up with your caregiver, as directed. °· If you are asked to monitor your blood pressure at home, follow the guidelines in the "Diagnosis" section above. °SEEK MEDICAL CARE   IF:  °· You think you are having medication side effects. °· You have recurrent headaches or lightheadedness. °· You have swelling in your ankles. °· You have trouble with  your vision. °SEEK IMMEDIATE MEDICAL CARE IF:  °· You have sudden onset of chest pain or pressure, difficulty breathing, or other symptoms of a heart attack. °· You have a severe headache. °· You have symptoms of a stroke (such as sudden weakness, difficulty speaking, difficulty walking). °MAKE SURE YOU:  °· Understand these instructions. °· Will watch your condition. °· Will get help right away if you are not doing well or get worse. °Document Released: 02/04/2005 Document Revised: 04/29/2011 Document Reviewed: 09/04/2006 °[employer]® Patient Information ©2014 [employer]. ° °

## 2013-05-11 NOTE — ED Notes (Signed)
Pt given a soda per fluid challenge order. 

## 2013-07-26 DIAGNOSIS — J309 Allergic rhinitis, unspecified: Secondary | ICD-10-CM | POA: Diagnosis not present

## 2013-07-26 DIAGNOSIS — K219 Gastro-esophageal reflux disease without esophagitis: Secondary | ICD-10-CM | POA: Diagnosis not present

## 2013-07-27 ENCOUNTER — Telehealth (HOSPITAL_COMMUNITY): Payer: Self-pay | Admitting: *Deleted

## 2013-07-27 NOTE — Telephone Encounter (Signed)
done

## 2013-08-03 ENCOUNTER — Ambulatory Visit (INDEPENDENT_AMBULATORY_CARE_PROVIDER_SITE_OTHER): Payer: Medicare Other | Admitting: Psychiatry

## 2013-08-03 ENCOUNTER — Encounter (HOSPITAL_COMMUNITY): Payer: Self-pay | Admitting: Psychiatry

## 2013-08-03 VITALS — BP 100/60 | Ht 70.0 in | Wt 202.0 lb

## 2013-08-03 DIAGNOSIS — F201 Disorganized schizophrenia: Secondary | ICD-10-CM

## 2013-08-03 DIAGNOSIS — F209 Schizophrenia, unspecified: Secondary | ICD-10-CM

## 2013-08-03 MED ORDER — BENZTROPINE MESYLATE 0.5 MG PO TABS: 0.5000 mg | ORAL_TABLET | Freq: Two times a day (BID) | ORAL | Status: DC

## 2013-08-03 MED ORDER — CHLORPROMAZINE HCL 200 MG PO TABS: ORAL_TABLET | ORAL | Status: DC

## 2013-08-03 MED ORDER — CHLORPROMAZINE HCL 200 MG PO TABS: 200.0000 mg | ORAL_TABLET | Freq: Three times a day (TID) | ORAL | Status: DC

## 2013-08-03 MED ORDER — LORAZEPAM 1 MG PO TABS: ORAL_TABLET | ORAL | Status: DC

## 2013-08-03 MED ORDER — RISPERIDONE 4 MG PO TABS: 4.0000 mg | ORAL_TABLET | Freq: Two times a day (BID) | ORAL | Status: DC

## 2013-08-03 NOTE — Progress Notes (Signed)
Patient ID: Theodore Smith, male   DOB: 03/21/1955, 58 y.o.   MRN: [id number] Patient ID: Theodore [facility], male   DOB: 03/21/1955, 58 y.o.   MRN: [id number] Patient ID: Theodore Smith, male   DOB: 03/21/1955, 58 y.o.   MRN: [id number] Theodore Smith [id number] Progress Note Theodore Smith MRN: [id number] DOB: 03/21/1955 Age: 58 y.o.  Date: 08/03/2013   Chief Complaint  Patient presents with  . Schizophrenia  . Follow-up   History of presenting illness Patient is a 58 year old [place] male who came for his followup appointment with the [facility]. He has been living in a rest home for about 30 years.  Returns after 3 months with his caregiver from the rest home. Recently he's not been sleeping well at night. He's been getting up at 2 and 3 in the morning and talking nonstop. He is already on a high dose of antipsychotic. His blood pressures have been normal so we can probably go back to 2 Ativan at bedtime to see if we can help him sleep better. He is mumbling a lot today but does not have any significant complaints  Past psychiatric history Patient has significant history of schizophrenia disorganized type.  [facility]  He do not recall the details of these admission however since 1999 he has been not admitted to psychiatric hospital.  He seen in this office since 1999 and has been managed on 2 antipsychotic medication.  Patient do not recall if he ever had any suicidal attempt.  Medical history Hyperlipidemia and GERD vitamin D deficiency IBS  Family History family history includes ADD / ADHD in his other; Alcohol abuse in his brother; Anxiety disorder in his brother and other; Depression in his brother and other; Drug abuse in his brother and other; Mental illness in his other. There is no history of Bipolar disorder, Dementia, OCD, Paranoid behavior, Schizophrenia, Seizures, Sexual abuse, or Physical  abuse.  Mental status examination Patient is fairly groomed and dressed.  He maintained limited eye contact.  His speech is incoherent and his thought processes are illogical. He is fairly cooperative and did not show any signs of agitation or anger but his attention and concentration is poor. He was n talking to himself during the conversation.  He also smiles randomly during the session.  He denies any active or passive suicidal thinking. He is cooperative and able to follow directions. His thought processes disorganized and his speech is rambling and incoherent. He endorsed talking to his God. He has no extrapyramidal side effects noted. He is alert noted the date close to today he has difficulty recalling old events. His insight judgment is poor and impulse control is fair.  Lab Results:  Results for orders placed during the hospital encounter of 05/11/13 (from the past 2016 hour(s))  CBC WITH DIFFERENTIAL   Collection Time    05/11/13  4:06 PM      Result Value Ref Range   WBC 8.4  4.0 - 10.5 K/uL   RBC 4.83  4.22 - 5.81 MIL/uL   Hemoglobin 14.7  13.0 - 17.0 g/dL   HCT 84.143.7  32.439.0 - 40.152.0 %   MCV 90.5  78.0 - 100.0 fL   MCH 30.4  26.0 - 34.0 pg   MCHC 33.6  30.0 - 36.0 g/dL   RDW 02.714.4  25.311.5 - 66.415.5 %   Platelets 203  150 - 400 K/uL  Neutrophils Relative % 66  43 - 77 %   Neutro Abs 5.5  1.7 - 7.7 K/uL   Lymphocytes Relative 26  12 - 46 %   Lymphs Abs 2.2  0.7 - 4.0 K/uL   Monocytes Relative 7  3 - 12 %   Monocytes Absolute 0.6  0.1 - 1.0 K/uL   Eosinophils Relative 1  0 - 5 %   Eosinophils Absolute 0.1  0.0 - 0.7 K/uL   Basophils Relative 0  0 - 1 %   Basophils Absolute 0.0  0.0 - 0.1 K/uL  COMPREHENSIVE METABOLIC PANEL   Collection Time    05/11/13  4:06 PM      Result Value Ref Range   Sodium 140  137 - 147 mEq/L   Potassium 3.5 (*) 3.7 - 5.3 mEq/L   Chloride 100  96 - 112 mEq/L   CO2 25  19 - 32 mEq/L   Glucose, Bld 99  70 - 99 mg/dL   BUN 6  6 - 23 mg/dL   Creatinine,  Ser 1.61  0.50 - 1.35 mg/dL   Calcium 9.4  8.4 - 09.6 mg/dL   Total Protein 8.1  6.0 - 8.3 g/dL   Albumin 4.0  3.5 - 5.2 g/dL   AST 22  0 - 37 U/L   ALT 14  0 - 53 U/L   Alkaline Phosphatase 86  39 - 117 U/L   Total Bilirubin 0.2 (*) 0.3 - 1.2 mg/dL   GFR calc non Af Amer 62 (*) >90 mL/min   GFR calc Af Amer 72 (*) >90 mL/min  TROPONIN I   Collection Time    05/11/13  4:06 PM      Result Value Ref Range   Troponin I <0.30  <0.30 ng/mL   PCP draws routine labs and nothing is emerging as of concern.  Assessment. Axis I Schizophrenia chronic disorganized type Axis II deferred Axis III hyperlipidemia and GERD Axis IV moderate Axis V 45  Plan/Discussion: He will continue his current dosages of Risperdal Cogentin and Thorazine. His Ativan will be age to 1 mg twice a day and 2 mg at bedtime. I also ordered weekly blood pressure and the staff is to call if his bottom number goes below 50  Followup in 3 months. Staff can call us at anytime if his agitation worsens. He'll return in 3 months  MEDICATIONS this encounter: Meds ordered this encounter  Medications  . LORazepam (ATIVAN) 1 MG tablet    Sig: Take one bid and two at bedtime    Dispense:  120 tablet    Refill:  2  . benztropine (COGENTIN) 0.5 MG tablet    Sig: Take 1 tablet (0.5 mg total) by mouth 2 (two) times daily.    Dispense:  60 tablet    Refill:  2  . risperidone (RISPERDAL) 4 MG tablet    Sig: Take 1 tablet (4 mg total) by mouth 2 (two) times daily.    Dispense:  60 tablet    Refill:  2  . chlorproMAZINE (THORAZINE) 200 MG tablet    Sig: Take 1-4 tablets (200-800 mg total) by mouth 3 (three) times daily. Takes one tablet (200mg  total) then takes four tablets (800mg  total) at bedtime    Dispense:  150 tablet    Refill:  2  . chlorproMAZINE (THORAZINE) 200 MG tablet    Sig: Takes one tablet in the am, one at noon and 4 at bedtime    Dispense:  180 tablet    Refill:  2    Medical Decision Making Problem Points:   Established problem, stable/improving (1), Review of last therapy session (1) and Review of psycho-social stressors (1) Data Points:  Review or order clinical lab tests (1) Review of medication regiment & side effects (2)  ROSS, DEBORAH, MD

## 2013-08-04 ENCOUNTER — Ambulatory Visit (HOSPITAL_COMMUNITY): Payer: Self-pay | Admitting: Psychiatry

## 2013-08-04 ENCOUNTER — Telehealth (HOSPITAL_COMMUNITY): Payer: Self-pay | Admitting: *Deleted

## 2013-08-05 NOTE — Telephone Encounter (Signed)
Sent to Dr. Ross for review 

## 2013-08-06 NOTE — Telephone Encounter (Signed)
Changed to lorazepam 2 mg.one half bid and one qhs

## 2013-10-19 DIAGNOSIS — E669 Obesity, unspecified: Secondary | ICD-10-CM | POA: Diagnosis not present

## 2013-10-19 DIAGNOSIS — F17200 Nicotine dependence, unspecified, uncomplicated: Secondary | ICD-10-CM | POA: Diagnosis not present

## 2013-10-19 DIAGNOSIS — E7800 Pure hypercholesterolemia, unspecified: Secondary | ICD-10-CM | POA: Diagnosis not present

## 2013-10-19 DIAGNOSIS — N390 Urinary tract infection, site not specified: Secondary | ICD-10-CM | POA: Diagnosis not present

## 2013-10-19 DIAGNOSIS — K5900 Constipation, unspecified: Secondary | ICD-10-CM | POA: Diagnosis not present

## 2013-10-19 DIAGNOSIS — R7309 Other abnormal glucose: Secondary | ICD-10-CM | POA: Diagnosis not present

## 2013-10-19 DIAGNOSIS — R799 Abnormal finding of blood chemistry, unspecified: Secondary | ICD-10-CM | POA: Diagnosis not present

## 2013-10-19 DIAGNOSIS — K219 Gastro-esophageal reflux disease without esophagitis: Secondary | ICD-10-CM | POA: Diagnosis not present

## 2013-11-03 ENCOUNTER — Ambulatory Visit (INDEPENDENT_AMBULATORY_CARE_PROVIDER_SITE_OTHER): Payer: Medicare Other | Admitting: Psychiatry

## 2013-11-03 ENCOUNTER — Encounter (HOSPITAL_COMMUNITY): Payer: Self-pay | Admitting: Psychiatry

## 2013-11-03 VITALS — BP 116/61 | HR 93 | Ht 70.0 in | Wt 189.8 lb

## 2013-11-03 DIAGNOSIS — F209 Schizophrenia, unspecified: Secondary | ICD-10-CM

## 2013-11-03 DIAGNOSIS — F201 Disorganized schizophrenia: Secondary | ICD-10-CM

## 2013-11-03 MED ORDER — CHLORPROMAZINE HCL 200 MG PO TABS: ORAL_TABLET | ORAL | Status: DC

## 2013-11-03 MED ORDER — BENZTROPINE MESYLATE 0.5 MG PO TABS: 0.5000 mg | ORAL_TABLET | Freq: Two times a day (BID) | ORAL | Status: DC

## 2013-11-03 MED ORDER — LORAZEPAM 2 MG PO TABS: ORAL_TABLET | ORAL | Status: DC

## 2013-11-03 MED ORDER — RISPERIDONE 4 MG PO TABS: 4.0000 mg | ORAL_TABLET | Freq: Two times a day (BID) | ORAL | Status: DC

## 2013-11-03 NOTE — Progress Notes (Signed)
Patient ID: KEREM GILMER, male   DOB: 16-Mar-1955, 58 y.o.   MRN: [id number] Patient ID: VINCENTE ASBRIDGE, male   DOB: Nov 18, 1955, 58 y.o.   MRN: [id number] Patient ID: VICENT FEBLES, male   DOB: [birth year], 58 y.o.   MRN: [id number] Patient ID: EDOUARD GIKAS, male   DOB: May 03, 1955, 58 y.o.   MRN: [id number] [facility] [id number] Progress Note NABIL BUBOLZ MRN: [id number] DOB: 21-Jun-1955 Age: 58 y.o.  Date: 11/03/2013   Chief Complaint  Patient presents with  . Schizophrenia  . Follow-up   History of presenting illness Patient is a 58 year old [place] male who came for his followup appointment with the staff of [facility]. He has been living in a rest home for about 30 years.  Returns after 3 months with his caregiver from the rest home. He is calmer today and seems to be doing better. He can actually answer some questions coherently. He still talks to himself a lot but claims he is not hearing voices. He still smoking all day long and I suggested he have more activities at the group home. He is sleeping quite well at night now. He's had any episodes of agitation or violence  Past psychiatric history Patient has significant history of schizophrenia disorganized type.  [facility]  He do not recall the details of these admission however since 1999 he has been not admitted to psychiatric hospital.  He seen in this office since 1999 and has been managed on 2 antipsychotic medication.  Patient do not recall if he ever had any suicidal attempt.  Medical history Hyperlipidemia and GERD vitamin D deficiency IBS  Family History family history includes ADD / ADHD in his other; Alcohol abuse in his brother; Anxiety disorder in his brother and other; Depression in his brother and other; Drug abuse in his brother and other; Mental illness in his other. There is no history of Bipolar disorder, Dementia, OCD, Paranoid  behavior, Schizophrenia, Seizures, Sexual abuse, or Physical abuse.  Mental status examination Patient is fairly groomed and dressed.  He maintained limited eye contact.  His speech is incoherent and his thought processes are illogical. He is fairly cooperative and did not show any signs of agitation or anger but his attention and concentration is poor. He was  talking to himself during the conversation.  He also smiles randomly during the session.  He denies any active or passive suicidal thinking. He is cooperative and able answer questions more coherently He has no extrapyramidal side effects noted. He is alert . His insight judgment is poor and impulse control is fair.  Lab Results:  No results found for this or any previous visit (from the past 2016 hour(s)). PCP draws routine labs and nothing is emerging as of concern.  Assessment. Axis I Schizophrenia chronic disorganized type Axis II deferred Axis III hyperlipidemia and GERD Axis IV moderate Axis V 45  Plan/Discussion: He will continue his current dosages of Risperdal Cogentin and Thorazine. His Ativan will be age to 1 mg twice a day and 2 mg at bedtime.  Followup in 3 months. Staff can call us at anytime if his agitation worsens. He'll return in 3 months  MEDICATIONS this encounter: Meds ordered this encounter  Medications  . benztropine (COGENTIN) 0.5 MG tablet    Sig: Take 1 tablet (0.5 mg total) by mouth 2 (two) times daily.    Dispense:  60 tablet  Refill:  2  . chlorproMAZINE (THORAZINE) 200 MG tablet    Sig: Takes one tablet in the am, one at noon and 4 at bedtime    Dispense:  180 tablet    Refill:  2  . risperidone (RISPERDAL) 4 MG tablet    Sig: Take 1 tablet (4 mg total) by mouth 2 (two) times daily.    Dispense:  60 tablet    Refill:  2  . LORazepam (ATIVAN) 2 MG tablet    Sig: Take one half tablet twice a day and one tablet at bedtime    Dispense:  60 tablet    Refill:  2    Medical Decision  Making Problem Points:  Established problem, stable/improving (1), Review of last therapy session (1) and Review of psycho-social stressors (1) Data Points:  Review or order clinical lab tests (1) Review of medication regiment & side effects (2)  Schwanda Zima, MD

## 2013-11-18 DIAGNOSIS — K219 Gastro-esophageal reflux disease without esophagitis: Secondary | ICD-10-CM | POA: Diagnosis not present

## 2013-11-26 ENCOUNTER — Emergency Department (HOSPITAL_COMMUNITY): Payer: Medicare Other

## 2013-11-26 ENCOUNTER — Encounter (HOSPITAL_COMMUNITY): Payer: Self-pay | Admitting: Emergency Medicine

## 2013-11-26 ENCOUNTER — Emergency Department (HOSPITAL_COMMUNITY)
Admission: EM | Admit: 2013-11-26 | Discharge: 2013-11-26 | Disposition: A | Discharge status: Home / Self Care | Payer: Medicare Other | Attending: Emergency Medicine | Admitting: Emergency Medicine

## 2013-11-26 DIAGNOSIS — E785 Hyperlipidemia, unspecified: Secondary | ICD-10-CM | POA: Insufficient documentation

## 2013-11-26 DIAGNOSIS — Z720 Tobacco use: Secondary | ICD-10-CM | POA: Diagnosis not present

## 2013-11-26 DIAGNOSIS — Z79899 Other long term (current) drug therapy: Secondary | ICD-10-CM | POA: Diagnosis not present

## 2013-11-26 DIAGNOSIS — F201 Disorganized schizophrenia: Secondary | ICD-10-CM | POA: Diagnosis not present

## 2013-11-26 DIAGNOSIS — R5383 Other fatigue: Secondary | ICD-10-CM | POA: Diagnosis not present

## 2013-11-26 DIAGNOSIS — R531 Weakness: Secondary | ICD-10-CM | POA: Diagnosis not present

## 2013-11-26 DIAGNOSIS — K219 Gastro-esophageal reflux disease without esophagitis: Secondary | ICD-10-CM | POA: Insufficient documentation

## 2013-11-26 DIAGNOSIS — J439 Emphysema, unspecified: Secondary | ICD-10-CM | POA: Diagnosis not present

## 2013-11-26 DIAGNOSIS — R42 Dizziness and giddiness: Secondary | ICD-10-CM | POA: Diagnosis not present

## 2013-11-26 DIAGNOSIS — R404 Transient alteration of awareness: Secondary | ICD-10-CM | POA: Diagnosis not present

## 2013-11-26 HISTORY — DX: Restlessness and agitation: R45.1

## 2013-11-26 LAB — COMPREHENSIVE METABOLIC PANEL
ALBUMIN: 3.6 g/dL (ref 3.5–5.2)
ALT: 14 U/L (ref 0–53)
ANION GAP: 13 (ref 5–15)
AST: 21 U/L (ref 0–37)
Alkaline Phosphatase: 74 U/L (ref 39–117)
BILIRUBIN TOTAL: 0.2 mg/dL — AB (ref 0.3–1.2)
BUN: 8 mg/dL (ref 6–23)
CHLORIDE: 104 meq/L (ref 96–112)
CO2: 24 mEq/L (ref 19–32)
Calcium: 8.8 mg/dL (ref 8.4–10.5)
Creatinine, Ser: 1.23 mg/dL (ref 0.50–1.35)
GFR calc Af Amer: 74 mL/min — ABNORMAL LOW (ref >90)
GFR calc non Af Amer: 64 mL/min — ABNORMAL LOW (ref >90)
Glucose, Bld: 97 mg/dL (ref 70–99)
POTASSIUM: 3.4 meq/L — AB (ref 3.7–5.3)
Sodium: 141 mEq/L (ref 137–147)
Total Protein: 7.1 g/dL (ref 6.0–8.3)

## 2013-11-26 LAB — RAPID URINE DRUG SCREEN, HOSP PERFORMED
Amphetamines: NOT DETECTED
Barbiturates: NOT DETECTED
Benzodiazepines: NOT DETECTED
Cocaine: NOT DETECTED
Opiates: NOT DETECTED
TETRAHYDROCANNABINOL: POSITIVE — AB

## 2013-11-26 LAB — CBC WITH DIFFERENTIAL/PLATELET
BASOS PCT: 0 % (ref 0–1)
Basophils Absolute: 0 10*3/uL (ref 0.0–0.1)
EOS ABS: 0.2 10*3/uL (ref 0.0–0.7)
Eosinophils Relative: 2 % (ref 0–5)
HCT: 41.6 % (ref 39.0–52.0)
Hemoglobin: 14.4 g/dL (ref 13.0–17.0)
Lymphocytes Relative: 32 % (ref 12–46)
Lymphs Abs: 2.4 10*3/uL (ref 0.7–4.0)
MCH: 30.1 pg (ref 26.0–34.0)
MCHC: 34.6 g/dL (ref 30.0–36.0)
MCV: 87 fL (ref 78.0–100.0)
Monocytes Absolute: 0.7 10*3/uL (ref 0.1–1.0)
Monocytes Relative: 9 % (ref 3–12)
NEUTROS PCT: 57 % (ref 43–77)
Neutro Abs: 4.3 10*3/uL (ref 1.7–7.7)
PLATELETS: 226 10*3/uL (ref 150–400)
RBC: 4.78 MIL/uL (ref 4.22–5.81)
RDW: 14.4 % (ref 11.5–15.5)
WBC: 7.6 10*3/uL (ref 4.0–10.5)

## 2013-11-26 LAB — URINALYSIS, ROUTINE W REFLEX MICROSCOPIC
Glucose, UA: NEGATIVE mg/dL
Hgb urine dipstick: NEGATIVE
Ketones, ur: NEGATIVE mg/dL
Leukocytes, UA: NEGATIVE
NITRITE: NEGATIVE
Protein, ur: NEGATIVE mg/dL
SPECIFIC GRAVITY, URINE: 1.015 (ref 1.005–1.030)
Urobilinogen, UA: 1 mg/dL (ref 0.0–1.0)
pH: 6.5 (ref 5.0–8.0)

## 2013-11-26 LAB — ETHANOL: Alcohol, Ethyl (B): <11 mg/dL (ref 0–11)

## 2013-11-26 LAB — TROPONIN I: Troponin I: <0.3 ng/mL (ref <0.30)

## 2013-11-26 LAB — AMMONIA: Ammonia: 28 umol/L (ref 11–60)

## 2013-11-26 NOTE — Discharge Instructions (Signed)
°Emergency Department Resource Guide °1) Find a Doctor and Pay Out of Pocket °Although you won't have to find out who is covered by your insurance plan, it is a good idea to ask around and get recommendations. You will then need to call the office and see if the doctor you have chosen will accept you as a new patient and what types of options they offer for patients who are self-pay. Some doctors offer discounts or will set up payment plans for their patients who do not have insurance, but you will need to ask so you aren't surprised when you get to your appointment. ° °2) Contact Your Local Health Department °Not all health departments have doctors that can see patients for sick visits, but many do, so it is worth a call to see if yours does. If you don't know where your local health department is, you can check in your phone book. The CDC also has a tool to help you locate your state's health department, and many state websites also have listings of all of their local health departments. ° °3) Find a Walk-in Clinic °If your illness is not likely to be very severe or complicated, you may want to try a walk in clinic. These are popping up all over the country in pharmacies, drugstores, and shopping centers. They're usually staffed by nurse practitioners or physician assistants that have been trained to treat common illnesses and complaints. They're usually fairly quick and inexpensive. However, if you have serious medical issues or chronic medical problems, these are probably not your best option. ° °No Primary Care Doctor: °- Call [facility] at  832-8000 - they can help you locate a primary care doctor that  accepts your insurance, provides certain services, etc. °- Physician Referral Service- 1-800-533-3463 ° °Chronic Pain Problems: °Organization         Address  Phone   Notes  °[facility]  ([phone]) [phone] Patients need to be referred by their primary care doctor.  ° °Medication  Assistance: °Organization         Address  Phone   Notes  °[place] [facility] [address] °[place], [place] [address] (336) 641-8030 --Must be a resident of [place] °-- Must have NO insurance coverage whatsoever (no Medicaid/ Medicare, etc.) °-- The pt. MUST have a primary care doctor that directs their care regularly and follows them in the community °  °[facility]  (866) 331-1348   °[employer]  (888) 892-1162   ° °Agencies that provide inexpensive medical care: °Organization         Address  Phone   Notes  °[facility]  (336) 832-8035   °[facility]    (336) 832-7272   °[facility] [address] °[place], [place] [address] (336) 832-4777   °[facility] [address], °[place] (336) 271-4999   °[facility]    (336) 373-0678   °[facility]    (336) 272-1050   °[facility] ° [address], [place] Phone:  (336) 832-4444, Fax:  (336) 832-4440 Hours of Operation:  9 am - 6 pm, M-F.  Also accepts Medicaid/Medicare and self-pay.  °[facility] ° [address], [place] Phone: (336) [phone], Fax: (336) [phone]. Hours of Operation:  8:30 am - 5:30 pm, M-F.  Also accepts Medicaid and self-pay.  °[facility] [place] [address], [place] Phone: (336) 878-6027   °[facility] [address], [place], [place] (336)723-1848, Ext. 123 Mondays & Thursdays: 7-9 AM.  First 15 patients are seen on a first come, first serve basis. °  ° °Medicaid-accepting [place] Providers: ° °Organization         Address  Phone   Notes  °[facility] [address], [place] (336) 641-2100 Also accepts self-pay patients.  °[facility] [address], [place] ° (336) 856-9996   °[facility] [address], [place]  (336) 288-8857   °[facility] [address] [place] [address], [place] (336) 299-7000   °Veita Bland [address], [place]  ° (336) 373-1557 Only accepts [place] Access Medicaid patients after they have their name applied to their card.  ° °Self-Pay (no insurance) in [place]: ° °Organization         Address  Phone   Notes  °Sickle Cell Patients, [facility] [address], [place] (336) 832-1970   °[facility] [address], [place] (336) 832-4400   °[facility] ° [address], [place] (336) 992-4800   °[facility]/Dr. Osei-Bonsu ° [address] [place] [address], [place] or [address], [place] (336) [phone] Phone number for both [place] and [place] locations is the same.  °[facility] [address], [place] (336) 299-0000   °[facility] [address] [place] [address], [place] or [address] (336) 852-7530 °(336) 878-2260   °[facility] [address], [place] (336) 350-1642, phone; (336) 294-5005, fax Sees patients 1st and 3rd Saturday of every month.  Must not qualify for public or private insurance (i.e. Medicaid, Medicare, [place] Health Choice, Veterans' Benefits) • Household income should be no more than 200% of the poverty level •The clinic cannot treat you if you are pregnant or think you are pregnant • Sexually transmitted diseases are not treated at the clinic.  ° ° °Dental Care: °Organization         Address  Phone  Notes  °[place] [facility] [address], [place] (336) 641-6152 Accepts children up to age 21 who are enrolled in Medicaid or [place] Health Choice; pregnant women with a Medicaid card; and children who have applied for Medicaid or [place] Health Choice, but were declined, whose parents can pay a reduced fee at time of service.  °[place]  [facility] [place]  [address], [place] (336) 641-7733 Accepts children up to age 21 who are enrolled in Medicaid or [place] Health Choice; pregnant women with a Medicaid card; and children who have applied for Medicaid or [place] Health Choice, but were declined, whose parents can pay a reduced fee at time of service.  °[facility] ° [address], [place] (336) 641-4533 Patients are seen by appointment only. Walk-ins are not accepted. [facility] will see patients 18 years of age and older. °Monday - Tuesday (8am-5pm) °Most Wednesdays (8:30-5pm) °$30 per visit, cash only  °[facility] ° [address], [place] (336) 641-4533 Patients are seen by appointment only. Walk-ins are not accepted. [facility] will see patients 18 years of age and older. °One   Wednesday Evening (Monthly: Volunteer Based).  $30 per visit, cash only  °[facility]  (919) 537-3737 for adults; Children under age 4, call Graduate Pediatric Dentistry at ([phone]) [phone]. Children aged 4-14, please call ([phone]) [phone] to request a pediatric application. ° Dental services are provided in all areas of dental care including fillings, crowns and bridges, complete and partial dentures, implants, gum treatment, root canals, and extractions. Preventive care is also provided. Treatment is provided to both adults and children. °Patients are selected via a lottery and there is often a waiting list. °  °[facility] [address], °[place] ° (336) 763-8833 [email or web address] °  °[facility] [address], [place], [place] (336)723-1848, Ext. 123 Second and Fourth Thursday of each month, opens at 6:30 AM; Clinic ends at 9 AM.  Patients are seen on a first-come first-served basis, and a limited number are seen during each clinic.  ° °[facility] ° [address], [place], [place] (336) 723-7904    Eligibility Requirements °You must have lived in [place], [place], or [place] counties for at least the last three months. °  You cannot be eligible for state or federal sponsored healthcare insurance, including Veterans Administration, Medicaid, or Medicare. °  You generally cannot be eligible for healthcare insurance through your employer.  °  How to apply: °Eligibility screenings are held every Tuesday and Wednesday afternoon from 1:00 pm until 4:00 pm. You do not need an appointment for the interview!  °[facility] [address], [place], [place] 336-631-2330   °[facility]  336-342-8273   °[facility]  336-703-3100   °[facility]  336-570-6415   ° °Behavioral Health Resources in the Community: °Intensive Outpatient Programs °Organization         Address  Phone  Notes  °[facility] [address], [place], [place] 336-878-6098   °[facility] [address], [place], [place] 336-832-9800   °[facility] [address], [place], [place] ° 336-882-2125   °[facility] [address],  °[place], [place] 1-800-853-5163 or 336-641-4981   °Substance Abuse Resources °Organization         Address  Phone  Notes  °[facility]  336-882-2125   °[facility]  336-784-9470   °[facility]  336-285-9073   °[facility]  336-845-3988   °Residential & Outpatient Substance Abuse Program  1-800-659-3381   °Psychological Services °Organization         Address  Phone  Notes  °[facility]  336- 832-9600   °[facility]  336- 378-7881   °[facility] [address], [place] 1-800-853-5163 or 336-641-4981   ° °Mobile Crisis Teams °Organization         Address  Phone  Notes  °[facility], Mobile Crisis Care Unit  1-877-626-1772   °Assertive °[facility] ° [address]  [place], [place] 336-834-9664   °Sharon DeEsch [address] °[place] 336-554-5454   ° °Self-Help/Support Groups °Organization         Address  Phone             Notes  °[facility]  - variety of support groups  336- 373-1402 Call for more information  °Narcotics Anonymous (NA), [facility] [address], °[place]  2 meetings at this location  ° °  Residential Treatment Programs Organization         Address  Phone  Notes  [facility] [address],    [place]  [phone]   [facility]  [address], [place] [phone], [place], [place] [phone]   [facility] [address], [place] [phone] Admissions: 8am-3pm M-F  [facility] [address]    [place], [place] [phone]   [facility] [address], [place], [place] [phone]   [facility] [address]  [place], [place] [phone]   [facility] Intensive Outpatient [address], [place], [place] [phone]   [facility] ([facility] [address]  [place], [place] [phone] or [phone]   [facility] [address] [place], [place] [phone] Accepts Medicaid  [facility] [address]  [place] [phone] Substance Abuse/Addiction Treatment   [facility]         Address  Phone  Notes  [facility]  204-355-2875(888) [phone]   Angie FavaJulie Brannon, PhD [address], [address] [place], [place]   [phone](336) [phone] or [phone](336) [phone]   [facility]   [address] [place], [place] [phone](336) [phone]   [facility] [address], [place], [place] [phone](336) [phone] Insurance/Medicaid/sponsorship through [facility]  [facility] [address]                                    [place], [place] [phone](336) [phone] Therapy/tele-psych/case    [facility] [address]   [place], [place] [phone](336) [phone]    Dr. Lolly MustacheArfeen  [phone](336) [phone]   [facility]  [facility] 1) [address],  2) [address], [place] 3)  [address], [place] [phone](336) [phone](336) [phone]  [phone](336) [phone]   [facility] Child Abuse Hot[facility] 8573953318(336) 6697652865 or [phone](336) [phone] (After Hours)      Take your usual prescriptions as previously directed.  Call your regular medical doctor on Monday to schedule a follow up appointment next week.  Return to the Emergency Department immediately sooner if worsening.

## 2013-11-26 NOTE — ED Provider Notes (Signed)
CSN: [id number]     Arrival date & time 11/26/13  1425 History   First MD Initiated Contact with Patient 11/26/13 1437     Chief Complaint  Patient presents with  . Weakness      HPI Pt was seen at 1450. Per EMS and pt report, c/o gradual onset and persistence of constant generalized weakness/fatigue for the past "several years." Pt states he "just feels tired" so he came to the ED for evaluation. Pt endorses recent psych meds changes within the past month. Denies any specific complaints. Denies CP/palpitations, no SOB/cough, no abd pain, no N/V/D, no fevers, no visual changes, no focal motor weakness, no tingling/numbness in extremities, no ataxia, no slurred speech, no facial droop.     Past Medical History  Diagnosis Date  . Hyperlipidemia   . GERD (gastroesophageal reflux disease)   . Schizophrenia     disorganized  . Agitation    History reviewed. No pertinent past surgical history.  Family History  Problem Relation Age of Onset  . Alcohol abuse Brother   . Anxiety disorder Brother   . Depression Brother   . Drug abuse Brother   . Mental illness Other   . ADD / ADHD Other   . Anxiety disorder Other   . Depression Other   . Drug abuse Other   . Bipolar disorder Neg Hx   . Dementia Neg Hx   . OCD Neg Hx   . Paranoid behavior Neg Hx   . Schizophrenia Neg Hx   . Seizures Neg Hx   . Sexual abuse Neg Hx   . Physical abuse Neg Hx    History  Substance Use Topics  . Smoking status: Current Every Day Smoker -- 2.00 packs/day    Types: Cigarettes  . Smokeless tobacco: Not on file     Comment: 11-15 cigs a day as of 06/10/12  . Alcohol Use: No    Review of Systems ROS: Statement: All systems negative except as marked or noted in the HPI; Constitutional: Negative for fever and chills. +generalized weakness/fatigue.; ; Eyes: Negative for eye pain, redness and discharge. ; ; ENMT: Negative for ear pain, hoarseness, nasal congestion, sinus pressure and sore throat. ; ;  Cardiovascular: Negative for chest pain, palpitations, diaphoresis, dyspnea and peripheral edema. ; ; Respiratory: Negative for cough, wheezing and stridor. ; ; Gastrointestinal: Negative for nausea, vomiting, diarrhea, abdominal pain, blood in stool, hematemesis, jaundice and rectal bleeding. . ; ; Genitourinary: Negative for dysuria, flank pain and hematuria. ; ; Musculoskeletal: Negative for back pain and neck pain. Negative for swelling and trauma.; ; Skin: Negative for pruritus, rash, abrasions, blisters, bruising and skin lesion.; ; Neuro: Negative for headache, lightheadedness and neck stiffness. Negative for altered level of consciousness , altered mental status, extremity weakness, paresthesias, involuntary movement, seizure and syncope.      Allergies  Review of patient's allergies indicates no known allergies.  Home Medications   Prior to Admission medications   Medication Sig Start Date End Date Taking? Authorizing Provider  benztropine (COGENTIN) 0.5 MG tablet Take 1 tablet (0.5 mg total) by mouth 2 (two) times daily. 11/03/13  Yes Diannia Ruder, MD  chlorproMAZINE (THORAZINE) 200 MG tablet Takes one tablet in the am, one at noon and 4 at bedtime 11/03/13  Yes Diannia Ruder, MD  cholecalciferol (VITAMIN D) 1000 UNITS tablet Take 2,000 Units by mouth 2 (two) times daily.    Yes Historical Provider, MD  docusate sodium (COLACE) 100 MG capsule Take  100 mg by mouth 2 (two) times daily.   Yes Historical Provider, MD  lactulose (CHRONULAC) 10 GM/15ML solution Take 15 g by mouth daily.   Yes Historical Provider, MD  Linaclotide Karlene Einstein) 145 MCG CAPS capsule Take 145 mcg by mouth daily.   Yes Historical Provider, MD  loratadine (CLARITIN) 10 MG tablet Take 10 mg by mouth daily.   Yes Historical Provider, MD  LORazepam (ATIVAN) 2 MG tablet Take one half tablet twice a day and one tablet at bedtime 11/03/13  Yes Diannia Ruder, MD  omeprazole (PRILOSEC) 20 MG capsule Take 20 mg by mouth daily.   Yes  Historical Provider, MD  risperidone (RISPERDAL) 4 MG tablet Take 1 tablet (4 mg total) by mouth 2 (two) times daily. 11/03/13  Yes Diannia Ruder, MD   BP 112/75  Pulse 85  Temp(Src) 98.9 F (37.2 C)  Resp 18  Ht 5\' 7"  (1.702 m)  Wt 189 lb (85.73 kg)  BMI 29.59 kg/m2  SpO2 95% Physical Exam 1455: Physical examination:  Nursing notes reviewed; Vital signs and O2 SAT reviewed;  Constitutional: Well developed, Well nourished, Well hydrated, In no acute distress; Head:  Normocephalic, atraumatic; Eyes: EOMI, PERRL, No scleral icterus; ENMT: Mouth and pharynx normal, Mucous membranes moist; Neck: Supple, Full range of motion, No lymphadenopathy; Cardiovascular: Regular rate and rhythm, No gallop; Respiratory: Breath sounds clear & equal bilaterally, No wheezes.  Speaking full sentences with ease, Normal respiratory effort/excursion; Chest: Nontender, Movement normal; Abdomen: Soft, Nontender, Nondistended, Normal bowel sounds; Genitourinary: No CVA tenderness; Extremities: Pulses normal, No tenderness, No edema, No calf edema or asymmetry.; Neuro: Awake, alert, oriented to person, place, events, mildly confused regarding time/date (he states this is normal for him), Major CN grossly intact. No facial droop. Speech clear. No gross focal motor or sensory deficits in extremities. Climbs on and off stretcher easily by himself. Gait steady.; Skin: Color normal, Warm, Dry.; Psych:  Affect flat, poor eye contact.    ED Course  Procedures     EKG Interpretation   Date/Time:  Friday November 26 2013 14:37:48 EDT Ventricular Rate:  84 PR Interval:  158 QRS Duration: 82 QT Interval:  411 QTC Calculation: 486 R Axis:   66 Text Interpretation:  Sinus rhythm Right atrial enlargement Probable  anteroseptal infarct, old Baseline wander When compared with ECG of  05/11/2013 No significant change was found Confirmed by [facility]  MD,  Nicholos Johns [phone]) on 11/26/2013 2:59:19 PM      MDM  MDM Reviewed:  previous chart, nursing note and vitals Reviewed previous: labs and ECG Interpretation: labs, ECG and x-ray    Results for orders placed during the hospital encounter of 11/26/13  URINE RAPID DRUG SCREEN (HOSP PERFORMED)      Result Value Ref Range   Opiates NONE DETECTED  NONE DETECTED   Cocaine NONE DETECTED  NONE DETECTED   Benzodiazepines NONE DETECTED  NONE DETECTED   Amphetamines NONE DETECTED  NONE DETECTED   Tetrahydrocannabinol POSITIVE (*) NONE DETECTED   Barbiturates NONE DETECTED  NONE DETECTED  ETHANOL      Result Value Ref Range   Alcohol, Ethyl (B) <11  0 - 11 mg/dL  CBC WITH DIFFERENTIAL      Result Value Ref Range   WBC 7.6  4.0 - 10.5 K/uL   RBC 4.78  4.22 - 5.81 MIL/uL   Hemoglobin 14.4  13.0 - 17.0 g/dL   HCT 60.4  54.0 - 98.1 %   MCV 87.0  78.0 -  100.0 fL   MCH 30.1  26.0 - 34.0 pg   MCHC 34.6  30.0 - 36.0 g/dL   RDW 86.514.4  78.411.5 - 69.615.5 %   Platelets 226  150 - 400 K/uL   Neutrophils Relative % 57  43 - 77 %   Neutro Abs 4.3  1.7 - 7.7 K/uL   Lymphocytes Relative 32  12 - 46 %   Lymphs Abs 2.4  0.7 - 4.0 K/uL   Monocytes Relative 9  3 - 12 %   Monocytes Absolute 0.7  0.1 - 1.0 K/uL   Eosinophils Relative 2  0 - 5 %   Eosinophils Absolute 0.2  0.0 - 0.7 K/uL   Basophils Relative 0  0 - 1 %   Basophils Absolute 0.0  0.0 - 0.1 K/uL  URINALYSIS, ROUTINE W REFLEX MICROSCOPIC      Result Value Ref Range   Color, Urine AMBER (*) YELLOW   APPearance CLEAR  CLEAR   Specific Gravity, Urine 1.015  1.005 - 1.030   pH 6.5  5.0 - 8.0   Glucose, UA NEGATIVE  NEGATIVE mg/dL   Hgb urine dipstick NEGATIVE  NEGATIVE   Bilirubin Urine SMALL (*) NEGATIVE   Ketones, ur NEGATIVE  NEGATIVE mg/dL   Protein, ur NEGATIVE  NEGATIVE mg/dL   Urobilinogen, UA 1.0  0.0 - 1.0 mg/dL   Nitrite NEGATIVE  NEGATIVE   Leukocytes, UA NEGATIVE  NEGATIVE  COMPREHENSIVE METABOLIC PANEL      Result Value Ref Range   Sodium 141  137 - 147 mEq/L   Potassium 3.4 (*) 3.7 - 5.3 mEq/L    Chloride 104  96 - 112 mEq/L   CO2 24  19 - 32 mEq/L   Glucose, Bld 97  70 - 99 mg/dL   BUN 8  6 - 23 mg/dL   Creatinine, Ser 2.951.23  0.50 - 1.35 mg/dL   Calcium 8.8  8.4 - 28.410.5 mg/dL   Total Protein 7.1  6.0 - 8.3 g/dL   Albumin 3.6  3.5 - 5.2 g/dL   AST 21  0 - 37 U/L   ALT 14  0 - 53 U/L   Alkaline Phosphatase 74  39 - 117 U/L   Total Bilirubin 0.2 (*) 0.3 - 1.2 mg/dL   GFR calc non Af Amer 64 (*) >90 mL/min   GFR calc Af Amer 74 (*) >90 mL/min   Anion gap 13  5 - 15  AMMONIA      Result Value Ref Range   Ammonia 28  11 - 60 umol/L  TROPONIN I      Result Value Ref Range   Troponin I <0.30  <0.30 ng/mL   Dg Chest 2 View 11/26/2013   CLINICAL DATA:  Weakness.  Tobacco use.  EXAM: CHEST  2 VIEW  COMPARISON:  08/24/2009 report  FINDINGS: Airway thickening is present, suggesting bronchitis or reactive airways disease. Tapering of the peripheral pulmonary vasculature favors emphysema. Cardiac and mediastinal margins appear normal. No pleural effusion.  IMPRESSION: 1. Airway thickening is present, suggesting bronchitis or reactive airways disease. 2. Emphysema.   Electronically Signed   By: Herbie BaltimoreWalt  Liebkemann M.D.   On: 11/26/2013 15:29    1740:  Pt's VS per baseline per EPIC chart review. Pt without symptoms during orthostatic VS. Pt has ambulated with steady gait, easy resps, NAD. Pt states he "feels ok now" and wants to go home. Dx and testing d/w pt.  Questions answered.  Verb understanding, agreeable  to d/c home with outpt f/u.   Samuel JesterKathleen Jariah Tarkowski, DO 11/29/13 1628

## 2013-11-26 NOTE — ED Notes (Signed)
Discharge instructions given and reviewed with administrator of family care home.  Patient ambulatory; discharged home in good condition.

## 2013-11-26 NOTE — ED Notes (Signed)
Patient sitting on side of bed; states he feels much better now.

## 2013-11-26 NOTE — ED Notes (Signed)
Patient c/o dizziness and generalized weakness all over.  Per EMS, patient has been c/o weakness for a long time, but tells this RN that it started after lunch.

## 2013-11-26 NOTE — ED Notes (Signed)
Patient ambulated; steady gait and no shortness of breath.

## 2013-11-26 NOTE — ED Notes (Signed)
Pt c/o generalized weakness since after lunch today. Pt alert and oriented to person/place and situation. Does not know date, but states that is normal for him. Pt awake, alert and following commands. nad noted.

## 2013-11-26 NOTE — ED Notes (Signed)
[facility] notified that patient is ready for discharge.

## 2013-11-28 LAB — URINE CULTURE
COLONY COUNT: NO GROWTH
Culture: NO GROWTH

## 2014-02-01 ENCOUNTER — Ambulatory Visit (INDEPENDENT_AMBULATORY_CARE_PROVIDER_SITE_OTHER): Payer: Medicare Other | Admitting: Psychiatry

## 2014-02-01 ENCOUNTER — Encounter (HOSPITAL_COMMUNITY): Payer: Self-pay | Admitting: Psychiatry

## 2014-02-01 VITALS — BP 99/63 | HR 86 | Ht 67.0 in | Wt 175.4 lb

## 2014-02-01 DIAGNOSIS — F258 Other schizoaffective disorders: Secondary | ICD-10-CM | POA: Diagnosis not present

## 2014-02-01 DIAGNOSIS — F200 Paranoid schizophrenia: Secondary | ICD-10-CM

## 2014-02-01 DIAGNOSIS — F259 Schizoaffective disorder, unspecified: Secondary | ICD-10-CM

## 2014-02-01 MED ORDER — LORAZEPAM 2 MG PO TABS: ORAL_TABLET | ORAL | Status: DC

## 2014-02-01 MED ORDER — RISPERIDONE 4 MG PO TABS: 4.0000 mg | ORAL_TABLET | Freq: Two times a day (BID) | ORAL | Status: DC

## 2014-02-01 MED ORDER — BENZTROPINE MESYLATE 0.5 MG PO TABS: 0.5000 mg | ORAL_TABLET | Freq: Two times a day (BID) | ORAL | Status: DC

## 2014-02-01 MED ORDER — CHLORPROMAZINE HCL 200 MG PO TABS: ORAL_TABLET | ORAL | Status: DC

## 2014-02-01 NOTE — Progress Notes (Signed)
Patient ID: Theodore Smith, male   DOB: [birth year], 58 y.o.   MRN: [id number] Patient ID: Theodore Smith, male   DOB: 11/05/1955, 58 y.o.   MRN: [id number] Patient ID: Theodore Smith, male   DOB: 10/15/1955, 58 y.o.   MRN: [id number] Patient ID: Theodore Smith, male   DOB: June 15, 1955, 58 y.o.   MRN: [id number] Patient ID: Theodore Smith, male   DOB: 07-03-1955, 58 y.o.   MRN: [id number] [facility] [id number] Progress Note Theodore Smith MRN: [id number] DOB: 05-May-1955 Age: 58 y.o.  Date: 02/01/2014   Chief Complaint  Patient presents with  . Schizophrenia  . Follow-up   History of presenting illness Patient is a 58 year old [place] American male who came for his followup appointment with the staff of [facility]. He has been living in a rest home for about 30 years.  Returns after 3 months with his caregiver from the rest home. He is calm today. He is somewhat difficult to understand because all his teeth have been pulled. His caregiver states that he has been calm and cooperative at the nursing home but he seems more drowsy than he used to be and sometimes sleeps during the day. He still talks to himself quite a bit but he denies auditory or visual hallucinations. He's not been angry or agitated at all. He's lost 25 pounds in the last several months but his caregiver thinks this is because his teeth have been pulled. He will be getting dentures in January  Past psychiatric history Patient has significant history of schizophrenia disorganized type.  [facility]  He do not recall the details of these admission however since 1999 he has been not admitted to psychiatric hospital.  He seen in this office since 1999 and has been managed on 2 antipsychotic medication.  Patient do not recall if he ever had any suicidal attempt.  Medical history Hyperlipidemia and GERD vitamin D deficiency IBS  Family History family history includes  ADD / ADHD in his other; Alcohol abuse in his brother; Anxiety disorder in his brother and other; Depression in his brother and other; Drug abuse in his brother and other; Mental illness in his other. There is no history of Bipolar disorder, Dementia, OCD, Paranoid behavior, Schizophrenia, Seizures, Sexual abuse, or Physical abuse.  Mental status examination Patient is fairly groomed and dressed.  He maintained limited eye contact.  His speech is incoherent and his thought processes are illogical. He is fairly cooperative and did not show any signs of agitation or anger but his attention and concentration is poor. He was  able to answer questions more appropriately today .  He denies any active or passive suicidal thinking. He is cooperative. He has no extrapyramidal side effects noted. He is alert . His insight judgment is poor and impulse control is fair.  Lab Results:  Results for orders placed or performed during the hospital encounter of 11/26/13 (from the past 2016 hour(s))  Ethanol   Collection Time: 11/26/13  3:04 PM  Result Value Ref Range   Alcohol, Ethyl (B) <11 0 - 11 mg/dL  CBC with Differential   Collection Time: 11/26/13  3:04 PM  Result Value Ref Range   WBC 7.6 4.0 - 10.5 K/uL   RBC 4.78 4.22 - 5.81 MIL/uL   Hemoglobin 14.4 13.0 - 17.0 g/dL   HCT 63.8 75.6 - 43.3 %   MCV 87.0 78.0 - 100.0 fL  MCH 30.1 26.0 - 34.0 pg   MCHC 34.6 30.0 - 36.0 g/dL   RDW 04.514.4 40.911.5 - 81.115.5 %   Platelets 226 150 - 400 K/uL   Neutrophils Relative % 57 43 - 77 %   Neutro Abs 4.3 1.7 - 7.7 K/uL   Lymphocytes Relative 32 12 - 46 %   Lymphs Abs 2.4 0.7 - 4.0 K/uL   Monocytes Relative 9 3 - 12 %   Monocytes Absolute 0.7 0.1 - 1.0 K/uL   Eosinophils Relative 2 0 - 5 %   Eosinophils Absolute 0.2 0.0 - 0.7 K/uL   Basophils Relative 0 0 - 1 %   Basophils Absolute 0.0 0.0 - 0.1 K/uL  Comprehensive metabolic panel   Collection Time: 11/26/13  3:04 PM  Result Value Ref Range   Sodium 141 137 - 147  mEq/L   Potassium 3.4 (L) 3.7 - 5.3 mEq/L   Chloride 104 96 - 112 mEq/L   CO2 24 19 - 32 mEq/L   Glucose, Bld 97 70 - 99 mg/dL   BUN 8 6 - 23 mg/dL   Creatinine, Ser 9.141.23 0.50 - 1.35 mg/dL   Calcium 8.8 8.4 - 78.210.5 mg/dL   Total Protein 7.1 6.0 - 8.3 g/dL   Albumin 3.6 3.5 - 5.2 g/dL   AST 21 0 - 37 U/L   ALT 14 0 - 53 U/L   Alkaline Phosphatase 74 39 - 117 U/L   Total Bilirubin 0.2 (L) 0.3 - 1.2 mg/dL   GFR calc non Af Amer 64 (L) >90 mL/min   GFR calc Af Amer 74 (L) >90 mL/min   Anion gap 13 5 - 15  Ammonia   Collection Time: 11/26/13  3:04 PM  Result Value Ref Range   Ammonia 28 11 - 60 umol/L  Troponin I   Collection Time: 11/26/13  3:04 PM  Result Value Ref Range   Troponin I <0.30 <0.30 ng/mL  Urine culture   Collection Time: 11/26/13  3:56 PM  Result Value Ref Range   Specimen Description URINE, CLEAN CATCH    Special Requests NONE    Culture  Setup Time      11/26/2013 23:02 Performed at [facility]   Colony Count NO GROWTH Performed at [facility]    Culture NO GROWTH Performed at [facility]    Report Status 11/28/2013 FINAL   Urine rapid drug screen (hosp performed)   Collection Time: 11/26/13  3:56 PM  Result Value Ref Range   Opiates NONE DETECTED NONE DETECTED   Cocaine NONE DETECTED NONE DETECTED   Benzodiazepines NONE DETECTED NONE DETECTED   Amphetamines NONE DETECTED NONE DETECTED   Tetrahydrocannabinol POSITIVE (A) NONE DETECTED   Barbiturates NONE DETECTED NONE DETECTED  Urinalysis, Routine w reflex microscopic   Collection Time: 11/26/13  3:56 PM  Result Value Ref Range   Color, Urine AMBER (A) YELLOW   APPearance CLEAR CLEAR   Specific Gravity, Urine 1.015 1.005 - 1.030   pH 6.5 5.0 - 8.0   Glucose, UA NEGATIVE NEGATIVE mg/dL   Hgb urine dipstick NEGATIVE NEGATIVE   Bilirubin Urine SMALL (A) NEGATIVE   Ketones, ur NEGATIVE NEGATIVE mg/dL   Protein, ur NEGATIVE NEGATIVE mg/dL   Urobilinogen, UA 1.0 0.0 - 1.0  mg/dL   Nitrite NEGATIVE NEGATIVE   Leukocytes, UA NEGATIVE NEGATIVE   PCP draws routine labs and nothing is emerging as of concern.  Assessment. Axis I Schizophrenia chronic disorganized type Axis II deferred  Axis III hyperlipidemia and GERD Axis IV moderate Axis V 45  Plan/Discussion: He will continue his current dosages of Risperdal Cogentin and Thorazine. His Ativan will be age to 1 mg twice a day and 2 mg at bedtime.  Followup in 3 months. Staff can call us at anytime if his agitation worsens. He'll return in 3 months  MEDICATIONS this encounter: Meds ordered this encounter  Medications  . risperidone (RISPERDAL) 4 MG tablet    Sig: Take 1 tablet (4 mg total) by mouth 2 (two) times daily.    Dispense:  60 tablet    Refill:  2  . chlorproMAZINE (THORAZINE) 200 MG tablet    Sig: Takes one tablet in the am, one at noon and 4 at bedtime    Dispense:  180 tablet    Refill:  2  . benztropine (COGENTIN) 0.5 MG tablet    Sig: Take 1 tablet (0.5 mg total) by mouth 2 (two) times daily.    Dispense:  60 tablet    Refill:  2  . LORazepam (ATIVAN) 2 MG tablet    Sig: Take one half tablet twice a day and one tablet at bedtime    Dispense:  60 tablet    Refill:  2    Medical Decision Making Problem Points:  Established problem, stable/improving (1), Review of last therapy session (1) and Review of psycho-social stressors (1) Data Points:  Review or order clinical lab tests (1) Review of medication regiment & side effects (2)  ROSS, DEBORAH, MD

## 2014-02-09 DIAGNOSIS — K219 Gastro-esophageal reflux disease without esophagitis: Secondary | ICD-10-CM | POA: Diagnosis not present

## 2014-02-09 DIAGNOSIS — F209 Schizophrenia, unspecified: Secondary | ICD-10-CM | POA: Diagnosis not present

## 2014-02-09 DIAGNOSIS — F17200 Nicotine dependence, unspecified, uncomplicated: Secondary | ICD-10-CM | POA: Diagnosis not present

## 2014-03-03 ENCOUNTER — Telehealth (HOSPITAL_COMMUNITY): Payer: Self-pay | Admitting: *Deleted

## 2014-03-03 NOTE — Telephone Encounter (Signed)
I prescribe it-please medication list

## 2014-03-03 NOTE — Telephone Encounter (Signed)
Phone call from Theodore Smith regarding Chlorpromazine, she said they don't have an order for this medication.  When asked who prescribed medication, she stated she don't know.

## 2014-03-07 NOTE — Telephone Encounter (Signed)
Called and spoke with Sharlyne PacasStephan and asked her to have Willaim RayasMary Thomas call office back. Number provided

## 2014-03-28 ENCOUNTER — Other Ambulatory Visit (HOSPITAL_COMMUNITY): Payer: Self-pay | Admitting: Psychiatry

## 2014-05-03 ENCOUNTER — Encounter (HOSPITAL_COMMUNITY): Payer: Self-pay | Admitting: Psychiatry

## 2014-05-03 ENCOUNTER — Ambulatory Visit (INDEPENDENT_AMBULATORY_CARE_PROVIDER_SITE_OTHER): Payer: Medicare Other | Admitting: Psychiatry

## 2014-05-03 VITALS — BP 93/53 | HR 92 | Ht 67.0 in | Wt 169.0 lb

## 2014-05-03 DIAGNOSIS — F200 Paranoid schizophrenia: Secondary | ICD-10-CM

## 2014-05-03 DIAGNOSIS — F201 Disorganized schizophrenia: Secondary | ICD-10-CM

## 2014-05-03 MED ORDER — LORAZEPAM 2 MG PO TABS: ORAL_TABLET | ORAL | Status: DC

## 2014-05-03 MED ORDER — RISPERIDONE 4 MG PO TABS: 4.0000 mg | ORAL_TABLET | Freq: Two times a day (BID) | ORAL | Status: DC

## 2014-05-03 MED ORDER — CHLORPROMAZINE HCL 200 MG PO TABS: ORAL_TABLET | ORAL | Status: DC

## 2014-05-03 MED ORDER — BENZTROPINE MESYLATE 0.5 MG PO TABS: 0.5000 mg | ORAL_TABLET | Freq: Two times a day (BID) | ORAL | Status: DC

## 2014-05-03 NOTE — Progress Notes (Signed)
Patient ID: Theodore Smith, male   DOB: 1955/08/19, 59 y.o.   MRN: [id number] Patient ID: Theodore Smith, male   DOB: 1955/08/19, 59 y.o.   MRN: [id number] Patient ID: Theodore Smith, male   DOB: 1955/08/19, 59 y.o.   MRN: [id number] Patient ID: Theodore Smith, male   DOB: 1955/08/19, 59 y.o.   MRN: [id number] Patient ID: Theodore Smith, male   DOB: 1955/08/19, 59 y.o.   MRN: [id number] Patient ID: Theodore Smith, male   DOB: 1955/08/19, 59 y.o.   MRN: [id number] [name] [id number] Progress Note Theodore Smith MRN: [id number] DOB: 1955/08/19 Age: 59 y.o.  Date: 05/03/2014   Chief Complaint  Patient presents with  . Schizophrenia  . Follow-up   History of presenting illness Patient is a 59 year old [place] male who came for his followup appointment with the staff of [facility]. He has been living in a rest home for about 30 years.  Returns after 3 months with his caregiver from the rest home. He is calm today. He is somewhat difficult to understand because all his teeth have been pulled. And he tends to ramble and talk to himself. His caregiver states that he often stays up all night and sleeps during the day. I've told her to ask the staff to try to not let him sleep during the day. He's not eating well and has lost another 6 pounds. His last labs done in October are normal. He chooses to smoke cigarettes and drinks sodas rather than eat and I talked to him about this again today. I've written an order to make sure his primary doctor knows about the weight loss as well.  Past psychiatric history Patient has significant history of schizophrenia disorganized type.  [facility]  He do not recall the details of these admission however since 1999 he has been not admitted to psychiatric hospital.  He seen in this office since 1999 and has been managed on 2 antipsychotic medication.  Patient do not recall if he ever had any  suicidal attempt.  Medical history Hyperlipidemia and GERD vitamin D deficiency IBS  Family History family history includes ADD / ADHD in his other; Alcohol abuse in his brother; Anxiety disorder in his brother and other; Depression in his brother and other; Drug abuse in his brother and other; Mental illness in his other. There is no history of Bipolar disorder, Dementia, OCD, Paranoid behavior, Schizophrenia, Seizures, Sexual abuse, or Physical abuse.  Mental status examination Patient is fairly groomed and dressed. His hair is growing out and he refuses to have his hair cut He maintained limited eye contact.  His speech is incoherent and his thought processes are illogical. He is fairly cooperative and did not show any signs of agitation or anger but his attention and concentration is poor. He was  able to answer questions more appropriately today .  He denies any active or passive suicidal thinking. He is cooperative. He has no extrapyramidal side effects noted. He is alert . His insight judgment is poor and impulse control is fair.  Lab Results:  No results found for this or any previous visit (from the past 2016 hour(s)). PCP draws routine labs and nothing is emerging as of concern.  Assessment. Axis I Schizophrenia chronic disorganized type Axis II deferred Axis III hyperlipidemia and GERD Axis IV moderate Axis V 45  Plan/Discussion: He will continue his current dosages of Risperdal  Cogentin and Thorazine. His Ativan will be continued at 1 mg twice a day and 2 mg at bedtime.  Followup in 3 months. Staff can call us at anytime if his agitation worsens. He'll return in 3 months  MEDICATIONS this encounter: Meds ordered this encounter  Medications  . risperidone (RISPERDAL) 4 MG tablet    Sig: Take 1 tablet (4 mg total) by mouth 2 (two) times daily.    Dispense:  60 tablet    Refill:  2  . benztropine (COGENTIN) 0.5 MG tablet    Sig: Take 1 tablet (0.5 mg total) by mouth 2 (two)  times daily.    Dispense:  60 tablet    Refill:  2  . chlorproMAZINE (THORAZINE) 200 MG tablet    Sig: Takes one tablet in the am, one at noon and 4 at bedtime    Dispense:  180 tablet    Refill:  2  . LORazepam (ATIVAN) 2 MG tablet    Sig: Take one half tablet twice a day and one tablet at bedtime    Dispense:  60 tablet    Refill:  2    Medical Decision Making Problem Points:  Established problem, stable/improving (1), Review of last therapy session (1) and Review of psycho-social stressors (1) Data Points:  Review or order clinical lab tests (1) Review of medication regiment & side effects (2)  Olisa Quesnel, MD

## 2014-05-18 DIAGNOSIS — D649 Anemia, unspecified: Secondary | ICD-10-CM | POA: Diagnosis not present

## 2014-05-18 DIAGNOSIS — E780 Pure hypercholesterolemia: Secondary | ICD-10-CM | POA: Diagnosis not present

## 2014-05-18 DIAGNOSIS — Z720 Tobacco use: Secondary | ICD-10-CM | POA: Diagnosis not present

## 2014-05-18 DIAGNOSIS — R739 Hyperglycemia, unspecified: Secondary | ICD-10-CM | POA: Diagnosis not present

## 2014-05-18 DIAGNOSIS — J449 Chronic obstructive pulmonary disease, unspecified: Secondary | ICD-10-CM | POA: Diagnosis not present

## 2014-05-18 DIAGNOSIS — K219 Gastro-esophageal reflux disease without esophagitis: Secondary | ICD-10-CM | POA: Diagnosis not present

## 2014-05-18 DIAGNOSIS — J40 Bronchitis, not specified as acute or chronic: Secondary | ICD-10-CM | POA: Diagnosis not present

## 2014-05-18 DIAGNOSIS — R634 Abnormal weight loss: Secondary | ICD-10-CM | POA: Diagnosis not present

## 2014-05-19 ENCOUNTER — Other Ambulatory Visit (HOSPITAL_COMMUNITY): Payer: Self-pay | Admitting: Internal Medicine

## 2014-05-19 ENCOUNTER — Ambulatory Visit (HOSPITAL_COMMUNITY)
Admission: RE | Admit: 2014-05-19 | Discharge: 2014-05-19 | Disposition: A | Discharge status: Home / Self Care | Payer: Medicare Other | Source: Ambulatory Visit | Attending: Internal Medicine | Admitting: Internal Medicine

## 2014-05-19 DIAGNOSIS — Z720 Tobacco use: Secondary | ICD-10-CM | POA: Diagnosis not present

## 2014-05-19 DIAGNOSIS — R0789 Other chest pain: Secondary | ICD-10-CM

## 2014-05-19 DIAGNOSIS — R05 Cough: Secondary | ICD-10-CM | POA: Diagnosis not present

## 2014-05-19 DIAGNOSIS — J449 Chronic obstructive pulmonary disease, unspecified: Secondary | ICD-10-CM | POA: Diagnosis not present

## 2014-05-30 ENCOUNTER — Telehealth: Payer: Self-pay

## 2014-05-30 NOTE — Telephone Encounter (Signed)
Called. LMOM for a return call.

## 2014-05-30 NOTE — Telephone Encounter (Signed)
Theodore Smith (CARETAKER) CALLED TO SCHEDULE HIS COLONOSCOPY [phone]

## 2014-06-06 NOTE — Telephone Encounter (Signed)
LMOM to call.

## 2014-06-08 NOTE — Telephone Encounter (Signed)
LMOM to call. Letter to pt and PCP.

## 2014-06-09 ENCOUNTER — Telehealth: Payer: Self-pay

## 2014-06-09 NOTE — Telephone Encounter (Signed)
I spoke to Willaim RayasMary Thomas, Caregiver, @ [phone].  [facility]. She gave me some triage info and will have meds and insurance info faxed to me. I told her then we will determine if pt requires office visit first. She said he is able to sign for himself and he understand well enough.

## 2014-06-16 NOTE — Telephone Encounter (Signed)
I received the med list. OV scheduled with Wynne DustEric Gill, NP on 07/04/2014 at 10:30 AM due to med list. Willaim RayasMary Thomas is aware.

## 2014-06-28 ENCOUNTER — Other Ambulatory Visit (HOSPITAL_COMMUNITY): Payer: Self-pay | Admitting: Psychiatry

## 2014-07-04 ENCOUNTER — Encounter: Payer: Self-pay | Admitting: Nurse Practitioner

## 2014-07-04 ENCOUNTER — Other Ambulatory Visit: Payer: Self-pay

## 2014-07-04 ENCOUNTER — Ambulatory Visit (INDEPENDENT_AMBULATORY_CARE_PROVIDER_SITE_OTHER): Payer: Medicare Other | Admitting: Nurse Practitioner

## 2014-07-04 VITALS — BP 87/59 | HR 94 | Temp 97.4°F | Ht 67.0 in | Wt 160.6 lb

## 2014-07-04 DIAGNOSIS — Z1211 Encounter for screening for malignant neoplasm of colon: Secondary | ICD-10-CM

## 2014-07-04 HISTORY — PX: OTHER SURGICAL HISTORY: SHX169

## 2014-07-04 MED ORDER — PEG 3350-KCL-NA BICARB-NACL 420 G PO SOLR
4000.0000 mL | Freq: Once | ORAL | Status: DC
Start: 2014-07-04 — End: 2015-08-01

## 2014-07-04 NOTE — Progress Notes (Signed)
Primary Care Physician:  Colon BranchQURESHI, AYYAZ, MD Primary Gastroenterologist:  Dr. Jena Gaussourk  Chief Complaint  Patient presents with  . Colonoscopy    HPI:   59 year old male presents on referral from PCP for screening colonoscopy. PCP notes reviewed. Patient was seen and noted to have some weight loss. No history of colonoscopy can be found in the system or in a printed notes. Patient is a history of schizophrenia and is accompanied by her caregiver was taken over the home as of 2015. Patient is able to sign for himself and understand most information. Scheduled for office visit due to polypharmacy. Office visit was assisted by information from a caregiver as well as the patient.  Today he states he is having unintentional weight loss. Has never had a colonoscopy. Denies abdominal pain, N/V. Denies hematochezia and melena. Denies fever, chills, syncope, near syncope. Describes occasional light-headedness which he attributes to his psych medications. Has a bowel movement every day to every two days. Denies constipation and diarrhea. Denies any other upper or lower GI symptoms.  Past Medical History  Diagnosis Date  . Hyperlipidemia   . GERD (gastroesophageal reflux disease)   . Schizophrenia     disorganized  . Agitation     No past surgical history on file.  Current Outpatient Prescriptions  Medication Sig Dispense Refill  . benztropine (COGENTIN) 0.5 MG tablet Take 1 tablet (0.5 mg total) by mouth 2 (two) times daily. 60 tablet 2  . chlorproMAZINE (THORAZINE) 200 MG tablet Takes one tablet in the am, one at noon and 4 at bedtime 180 tablet 2  . cholecalciferol (VITAMIN D) 1000 UNITS tablet Take 2,000 Units by mouth 2 (two) times daily.     Marland Kitchen. docusate sodium (COLACE) 100 MG capsule Take 100 mg by mouth 2 (two) times daily.    Marland Kitchen. lactulose (CHRONULAC) 10 GM/15ML solution Take 15 g by mouth daily.    . Linaclotide (LINZESS) 145 MCG CAPS capsule Take 145 mcg by mouth daily.    Marland Kitchen. loratadine  (CLARITIN) 10 MG tablet Take 10 mg by mouth daily.    Marland Kitchen. LORazepam (ATIVAN) 2 MG tablet Take one half tablet twice a day and one tablet at bedtime 60 tablet 2  . omeprazole (PRILOSEC) 20 MG capsule Take 20 mg by mouth daily.    . risperidone (RISPERDAL) 4 MG tablet Take 1 tablet (4 mg total) by mouth 2 (two) times daily. 60 tablet 2   No current facility-administered medications for this visit.    Allergies as of 07/04/2014  . (No Known Allergies)    Family History  Problem Relation Age of Onset  . Alcohol abuse Brother   . Anxiety disorder Brother   . Depression Brother   . Drug abuse Brother   . Mental illness Other   . ADD / ADHD Other   . Anxiety disorder Other   . Depression Other   . Drug abuse Other   . Bipolar disorder Neg Hx   . Dementia Neg Hx   . OCD Neg Hx   . Paranoid behavior Neg Hx   . Schizophrenia Neg Hx   . Seizures Neg Hx   . Sexual abuse Neg Hx   . Physical abuse Neg Hx     History   Social History  . Marital Status: Single    Spouse Name: N/A  . Number of Children: N/A  . Years of Education: N/A   Occupational History  . Not on file.   Social History  Main Topics  . Smoking status: Current Every Day Smoker -- 2.00 packs/day    Types: Cigarettes  . Smokeless tobacco: Not on file     Comment: one to one and a half packs daily  . Alcohol Use: No  . Drug Use: No  . Sexual Activity: Not on file   Other Topics Concern  . Not on file   Social History Narrative    Review of Systems: General: Negative for anorexia, fever, chills, fatigue, weakness. Admits weight loss.  Eyes: Negative for vision changes.  ENT: Negative for hoarseness, difficulty swallowing. CV: Negative for chest pain, angina, palpitations, peripheral edema.  Respiratory: Negative for dyspnea at rest, cough, wheezing.  GI: See history of present illness MS: Negative for joint pain, low back pain.  Derm: Negative for rash or itching.  Psych: Negative for anxiety, depression.   Endo: Admits weight loss.  Heme: Negative for bruising or bleeding. Allergy: Negative for rash or hives.    Physical Exam: BP 87/59 mmHg  Pulse 94  Temp(Src) 97.4 F (36.3 C) (Oral)  Ht 5\' 7"  (1.702 m)  Wt 160 lb 9.6 oz (72.848 kg)  BMI 25.15 kg/m2 General:   Alert and oriented. Pleasant and cooperative. Well-nourished and well-developed.  Head:  Normocephalic and atraumatic. Eyes:  Without icterus, sclera clear and conjunctiva pink.  Ears:  Normal auditory acuity. Mouth:  No deformity or lesions, oral mucosa pink. No OP edema. Neck:  Supple, without mass or thyromegaly. Lungs:  Clear to auscultation bilaterally. No wheezes, rales, or rhonchi. No distress.  Heart:  S1, S2 present without murmurs appreciated.  Abdomen:  +BS, soft, non-tender and non-distended. No HSM noted. No guarding or rebound. No masses appreciated.  Rectal:  Deferred  Msk:  Symmetrical without gross deformities. Normal posture. Extremities:  Without clubbing or edema. Neurologic:  Alert and  oriented x4;  grossly normal neurologically. Skin:  Intact without significant lesions or rashes. Psych:  Alert and cooperative. Normal mood. Speech difficult to understand, flat affect.     07/04/2014 10:54 AM

## 2014-07-04 NOTE — Assessment & Plan Note (Signed)
59 year old male referred by PCP for weight loss and need for screening colonoscopy. Patient is never had a colonoscopy today. He is essentially asymptomatic from a GI standpoint other than possible weight loss. His history of schizophrenia and is on several medications that could affect his sedation success. We will proceed with his colonoscopy in the OR with propofol due to polypharmacy.  Proceed with TCS in the OR with propofol with Dr. Jena Gaussourk in near future: the risks, benefits, and alternatives have been discussed with the patient in detail. The patient states understanding and desires to proceed.  Patient is not on any anticoagulants. Patient is on congestion, Thorazine, Ativan, and Risperdal. No diabetes medications. We'll proceed with procedure and the OR of propofol was previously described.

## 2014-07-04 NOTE — Patient Instructions (Signed)
1. We will schedule your procedure (colonoscopy) for you. 2. Further recommendations to be based on results your procedure. 

## 2014-07-06 NOTE — Progress Notes (Signed)
cc'ed to pcp °

## 2014-07-15 NOTE — Patient Instructions (Signed)
Theodore Smith  07/15/2014     @PREFPERIOPPHARMACY @   Your procedure is scheduled on 07/21/2014  [facility] to [facility] at  1115  A.M.  Call this number if you have problems the morning of surgery:  [phone]   Remember:  Do not eat food or drink liquids after midnight.  Take these medicines the morning of surgery with A SIP OF WATER  Cogentin, thorazine, ativan, claritin, prilosec, risperadal.   Do not wear jewelry, make-up or nail polish.  Do not wear lotions, powders, or perfumes.   Do not shave 48 hours prior to surgery.  Men may shave face and neck.  Do not bring valuables to the hospital.  [facility] is not responsible for any belongings or valuables.  Contacts, dentures or bridgework may not be worn into surgery.  Leave your suitcase in the car.  After surgery it may be brought to your room.  For patients admitted to the hospital, discharge time will be determined by your treatment team.  Patients discharged the day of surgery will not be allowed to drive home.   Name and phone number of your driver:   family Special instructions:  none  Please read over the following fact sheets that you were given. Pain Booklet, Coughing and Deep Breathing, Surgical Site Infection Prevention, Anesthesia Post-op Instructions and Care and Recovery After Surgery      Colonoscopy A colonoscopy is an exam to look at the entire large intestine (colon). This exam can help find problems such as tumors, polyps, inflammation, and areas of bleeding. The exam takes about 1 hour.  LET [facility] PROVIDER KNOW ABOUT:   Any allergies you have.  All medicines you are taking, including vitamins, herbs, eye drops, creams, and over-the-counter medicines.  Previous problems you or members of your family have had with the use of anesthetics.  Any blood disorders you have.  Previous surgeries you have had.  Medical conditions you have. RISKS AND COMPLICATIONS  Generally, this is  a safe procedure. However, as with any procedure, complications can occur. Possible complications include:  Bleeding.  Tearing or rupture of the colon wall.  Reaction to medicines given during the exam.  Infection (rare). BEFORE THE PROCEDURE   Ask your health care provider about changing or stopping your regular medicines.  You may be prescribed an oral bowel prep. This involves drinking a large amount of medicated liquid, starting the day before your procedure. The liquid will cause you to have multiple loose stools until your stool is almost clear or light green. This cleans out your colon in preparation for the procedure.  Do not eat or drink anything else once you have started the bowel prep, unless your health care provider tells you it is safe to do so.  Arrange for someone to drive you home after the procedure. PROCEDURE   You will be given medicine to help you relax (sedative).  You will lie on your side with your knees bent.  A long, flexible tube with a light and camera on the end (colonoscope) will be inserted through the rectum and into the colon. The camera sends video back to a computer screen as it moves through the colon. The colonoscope also releases carbon dioxide gas to inflate the colon. This helps your health care provider see the area better.  During the exam, your health care provider may take a small tissue sample (biopsy) to be examined under a microscope if  any abnormalities are found.  The exam is finished when the entire colon has been viewed. AFTER THE PROCEDURE   Do not drive for 24 hours after the exam.  You may have a small amount of blood in your stool.  You may pass moderate amounts of gas and have mild abdominal cramping or bloating. This is caused by the gas used to inflate your colon during the exam.  Ask when your test results will be ready and how you will get your results. Make sure you get your test results. Document Released: 02/02/2000  Document Revised: 11/25/2012 Document Reviewed: 10/12/2012 [facility] Patient Information 2015 [place], [place]. This information is not intended to replace advice given to you by your health care provider. Make sure you discuss any questions you have with your health care provider. PATIENT INSTRUCTIONS POST-ANESTHESIA  IMMEDIATELY FOLLOWING SURGERY:  Do not drive or operate machinery for the first twenty four hours after surgery.  Do not make any important decisions for twenty four hours after surgery or while taking narcotic pain medications or sedatives.  If you develop intractable nausea and vomiting or a severe headache please notify your doctor immediately.  FOLLOW-UP:  Please make an appointment with your surgeon as instructed. You do not need to follow up with anesthesia unless specifically instructed to do so.  WOUND CARE INSTRUCTIONS (if applicable):  Keep a dry clean dressing on the anesthesia/puncture wound site if there is drainage.  Once the wound has quit draining you may leave it open to air.  Generally you should leave the bandage intact for twenty four hours unless there is drainage.  If the epidural site drains for more than 36-48 hours please call the anesthesia department.  QUESTIONS?:  Please feel free to call your physician or the hospital operator if you have any questions, and they will be happy to assist you.

## 2014-07-19 ENCOUNTER — Encounter (HOSPITAL_COMMUNITY): Payer: Self-pay

## 2014-07-19 ENCOUNTER — Encounter (HOSPITAL_COMMUNITY)
Admission: RE | Admit: 2014-07-19 | Discharge: 2014-07-19 | Disposition: A | Discharge status: Home / Self Care | Payer: Medicare Other | Source: Ambulatory Visit | Attending: Internal Medicine | Admitting: Internal Medicine

## 2014-07-19 DIAGNOSIS — F17210 Nicotine dependence, cigarettes, uncomplicated: Secondary | ICD-10-CM | POA: Diagnosis not present

## 2014-07-19 DIAGNOSIS — K219 Gastro-esophageal reflux disease without esophagitis: Secondary | ICD-10-CM | POA: Diagnosis not present

## 2014-07-19 DIAGNOSIS — K621 Rectal polyp: Secondary | ICD-10-CM | POA: Diagnosis not present

## 2014-07-19 DIAGNOSIS — Z1211 Encounter for screening for malignant neoplasm of colon: Secondary | ICD-10-CM | POA: Diagnosis not present

## 2014-07-19 DIAGNOSIS — F209 Schizophrenia, unspecified: Secondary | ICD-10-CM | POA: Diagnosis not present

## 2014-07-19 DIAGNOSIS — E785 Hyperlipidemia, unspecified: Secondary | ICD-10-CM | POA: Diagnosis not present

## 2014-07-19 LAB — CBC WITH DIFFERENTIAL/PLATELET
Basophils Absolute: 0 10*3/uL (ref 0.0–0.1)
Basophils Relative: 0 % (ref 0–1)
EOS ABS: 0.1 10*3/uL (ref 0.0–0.7)
EOS PCT: 2 % (ref 0–5)
HEMATOCRIT: 43.8 % (ref 39.0–52.0)
Hemoglobin: 14.8 g/dL (ref 13.0–17.0)
Lymphocytes Relative: 24 % (ref 12–46)
Lymphs Abs: 1.8 10*3/uL (ref 0.7–4.0)
MCH: 29.8 pg (ref 26.0–34.0)
MCHC: 33.8 g/dL (ref 30.0–36.0)
MCV: 88.1 fL (ref 78.0–100.0)
Monocytes Absolute: 0.5 10*3/uL (ref 0.1–1.0)
Monocytes Relative: 7 % (ref 3–12)
Neutro Abs: 5 10*3/uL (ref 1.7–7.7)
Neutrophils Relative %: 67 % (ref 43–77)
Platelets: 236 10*3/uL (ref 150–400)
RBC: 4.97 MIL/uL (ref 4.22–5.81)
RDW: 15 % (ref 11.5–15.5)
WBC: 7.5 10*3/uL (ref 4.0–10.5)

## 2014-07-19 LAB — BASIC METABOLIC PANEL
ANION GAP: 11 (ref 5–15)
BUN: 13 mg/dL (ref 6–20)
CO2: 22 mmol/L (ref 22–32)
Calcium: 9.4 mg/dL (ref 8.9–10.3)
Chloride: 106 mmol/L (ref 101–111)
Creatinine, Ser: 1.13 mg/dL (ref 0.61–1.24)
GFR calc Af Amer: >60 mL/min (ref >60)
Glucose, Bld: 97 mg/dL (ref 65–99)
POTASSIUM: 4.2 mmol/L (ref 3.5–5.1)
SODIUM: 139 mmol/L (ref 135–145)

## 2014-07-19 NOTE — Pre-Procedure Instructions (Signed)
Theodore RayasMary Smith, caregiver, given information to sign up for my chart if interested.

## 2014-07-21 ENCOUNTER — Encounter (HOSPITAL_COMMUNITY)
Admission: RE | Disposition: A | Discharge status: Home / Self Care | Payer: Self-pay | Source: Ambulatory Visit | Attending: Internal Medicine

## 2014-07-21 ENCOUNTER — Ambulatory Visit (HOSPITAL_COMMUNITY)
Admission: RE | Admit: 2014-07-21 | Discharge: 2014-07-21 | Disposition: A | Discharge status: Home / Self Care | Payer: Medicare Other | Source: Ambulatory Visit | Attending: Internal Medicine | Admitting: Internal Medicine

## 2014-07-21 ENCOUNTER — Ambulatory Visit (HOSPITAL_COMMUNITY): Payer: Medicare Other | Admitting: Anesthesiology

## 2014-07-21 ENCOUNTER — Encounter (HOSPITAL_COMMUNITY): Payer: Self-pay | Admitting: *Deleted

## 2014-07-21 DIAGNOSIS — F17210 Nicotine dependence, cigarettes, uncomplicated: Secondary | ICD-10-CM | POA: Insufficient documentation

## 2014-07-21 DIAGNOSIS — K621 Rectal polyp: Secondary | ICD-10-CM | POA: Diagnosis not present

## 2014-07-21 DIAGNOSIS — F209 Schizophrenia, unspecified: Secondary | ICD-10-CM | POA: Diagnosis not present

## 2014-07-21 DIAGNOSIS — E785 Hyperlipidemia, unspecified: Secondary | ICD-10-CM | POA: Insufficient documentation

## 2014-07-21 DIAGNOSIS — K219 Gastro-esophageal reflux disease without esophagitis: Secondary | ICD-10-CM | POA: Insufficient documentation

## 2014-07-21 DIAGNOSIS — Z1211 Encounter for screening for malignant neoplasm of colon: Secondary | ICD-10-CM | POA: Diagnosis not present

## 2014-07-21 DIAGNOSIS — Z86010 Personal history of colonic polyps: Secondary | ICD-10-CM | POA: Insufficient documentation

## 2014-07-21 HISTORY — PX: POLYPECTOMY: SHX5525

## 2014-07-21 HISTORY — PX: COLONOSCOPY WITH PROPOFOL: SHX5780

## 2014-07-21 SURGERY — COLONOSCOPY WITH PROPOFOL
Anesthesia: Monitor Anesthesia Care

## 2014-07-21 MED ORDER — PROPOFOL 10 MG/ML IV BOLUS
INTRAVENOUS | Status: AC
  Filled 2014-07-21: qty 20

## 2014-07-21 MED ORDER — ONDANSETRON HCL 4 MG/2ML IJ SOLN: 4.0000 mg | Freq: Once | INTRAMUSCULAR | Status: AC | PRN

## 2014-07-21 MED ORDER — STERILE WATER FOR IRRIGATION IR SOLN
Status: DC | PRN
  Administered 2014-07-21: 1000 mL

## 2014-07-21 MED ORDER — PROPOFOL INFUSION 10 MG/ML OPTIME
INTRAVENOUS | Status: DC | PRN
  Administered 2014-07-21: 100 ug/kg/min via INTRAVENOUS

## 2014-07-21 MED ORDER — MIDAZOLAM HCL 5 MG/5ML IJ SOLN
INTRAMUSCULAR | Status: DC | PRN
  Administered 2014-07-21 (×2): 1 mg via INTRAVENOUS

## 2014-07-21 MED ORDER — SIMETHICONE 40 MG/0.6ML PO SUSP
ORAL | Status: DC | PRN
  Administered 2014-07-21: 1000 mL

## 2014-07-21 MED ORDER — MIDAZOLAM HCL 2 MG/2ML IJ SOLN
1.0000 mg | INTRAMUSCULAR | Status: DC | PRN
  Administered 2014-07-21: 2 mg via INTRAVENOUS

## 2014-07-21 MED ORDER — MIDAZOLAM HCL 2 MG/2ML IJ SOLN
INTRAMUSCULAR | Status: AC
  Filled 2014-07-21: qty 2

## 2014-07-21 MED ORDER — LACTATED RINGERS IV SOLN
INTRAVENOUS | Status: DC
  Administered 2014-07-21: 10:00:00 via INTRAVENOUS

## 2014-07-21 MED ORDER — FENTANYL CITRATE (PF) 100 MCG/2ML IJ SOLN
INTRAMUSCULAR | Status: AC
  Filled 2014-07-21: qty 2

## 2014-07-21 MED ORDER — FENTANYL CITRATE (PF) 100 MCG/2ML IJ SOLN: 25.0000 ug | INTRAMUSCULAR | Status: DC | PRN

## 2014-07-21 MED ORDER — FENTANYL CITRATE (PF) 100 MCG/2ML IJ SOLN
25.0000 ug | INTRAMUSCULAR | Status: AC
  Administered 2014-07-21 (×2): 25 ug via INTRAVENOUS

## 2014-07-21 SURGICAL SUPPLY — 22 items
ELECT REM PT RETURN 9FT ADLT (ELECTROSURGICAL) ×3
ELECTRODE REM PT RTRN 9FT ADLT (ELECTROSURGICAL) IMPLANT
FCP BXJMBJMB 240X2.8X (CUTTING FORCEPS)
FLOOR PAD 36X40 (MISCELLANEOUS)
FORCEPS BIOP RAD 4 LRG CAP 4 (CUTTING FORCEPS) ×2 IMPLANT
FORCEPS BIOP RJ4 240 W/NDL (CUTTING FORCEPS)
FORCEPS BXJMBJMB 240X2.8X (CUTTING FORCEPS) IMPLANT
FORMALIN 10 PREFIL 20ML (MISCELLANEOUS) ×2 IMPLANT
INJECTOR/SNARE I SNARE (MISCELLANEOUS) IMPLANT
KIT ENDO PROCEDURE PEN (KITS) ×3 IMPLANT
MANIFOLD NEPTUNE II (INSTRUMENTS) ×2 IMPLANT
NDL SCLEROTHERAPY 25GX240 (NEEDLE) IMPLANT
NEEDLE SCLEROTHERAPY 25GX240 (NEEDLE) IMPLANT
PAD FLOOR 36X40 (MISCELLANEOUS) IMPLANT
PROBE APC STR FIRE (PROBE) IMPLANT
PROBE INJECTION GOLD (MISCELLANEOUS)
PROBE INJECTION GOLD 7FR (MISCELLANEOUS) IMPLANT
SNARE ROTATE MED OVAL 20MM (MISCELLANEOUS) ×2 IMPLANT
SNARE SHORT THROW 13M SML OVAL (MISCELLANEOUS) IMPLANT
TRAP SPECIMEN MUCOUS 40CC (MISCELLANEOUS) IMPLANT
TUBING IRRIGATION ENDOGATOR (MISCELLANEOUS) ×2 IMPLANT
WATER STERILE IRR 1000ML POUR (IV SOLUTION) ×2 IMPLANT

## 2014-07-21 NOTE — Op Note (Signed)
[facility] [address] [place], [id number]   COLONOSCOPY PROCEDURE REPORT  PATIENT: Theodore Smith, Theodore Smith  MR#: [id number] BIRTHDATE: 20-Jan-1956 , 58  yrs. old GENDER: male ENDOSCOPIST: R.  Roetta SessionsMichael Rourk, MD FACP [facility] REFERRED BY:Dr. Virgina OrganQureshi PROCEDURE DATE:  07/21/2014 PROCEDURE:   Colonoscopy with snare polypectomy INDICATIONS:First-ever average risk colorectal cancer screening examination. MEDICATIONS: Deep sedation per Dr.  [facility] ASA CLASS:       Class II  CONSENT: The risks, benefits, alternatives and imponderables including but not limited to bleeding, perforation as well as the possibility of a missed lesion have been reviewed.  The potential for biopsy, lesion removal, etc. have also been discussed. Questions have been answered.  All parties agreeable.  Please see the history and physical in the medical record for more information.  DESCRIPTION OF PROCEDURE:   After the risks benefits and alternatives of the procedure were thoroughly explained, informed consent was obtained.  The digital rectal exam revealed no abnormalities of the rectum.   The     endoscope was introduced through the anus and advanced to the cecum, which was identified by both the appendix and ileocecal valve. No adverse events experienced.   The quality of the prep was inadequate  The instrument was then slowly withdrawn as the colon was fully examined.      COLON FINDINGS: (1) 1 cm sessile polyp in the rectum at 10 cm from anal verge; otherwise, the remainder of the rectal mucosa appeared normal.  He had a redundant colon.  Inadequate preparation. External abdominal pressure needed to reach the cecum.  Vegetable matter and viscous colonic effluent throughout the colon made exam more difficult.  In spite of copious washing and suctioning; I could not gain visualization of all mucosal surfaces.  The effluent kept clogging up the scope; no other abnormalities were  observed, however ,all mucosal surfaces not seen as described above. Retroflexion was performed. .  The rectal polyp was hot snare removed.  Withdrawal time=12 minutes 0 seconds.  The scope was withdrawn and the procedure completed. COMPLICATIONS: There were no immediate complications.  ENDOSCOPIC IMPRESSION: Rectal polyp?"removed as described above. Inadequate preparation precluded complete examination.  RECOMMENDATIONS: Follow-up on pathology. Patient will need an early interval follow-up colonoscopy. He will need an extra 2 Smith of preparation and an extra half a day of a clear liquid diet.  eSigned:  R. Roetta SessionsMichael Rourk, MD Jerrel IvoryFACP [facility] 07/21/2014 10:46 AM   cc:  CPT CODES: ICD CODES:  The ICD and CPT codes recommended by this software are interpretations from the data that the clinical staff has captured with the software.  The verification of the translation of this report to the ICD and CPT codes and modifiers is the sole responsibility of the health care institution and practicing physician where this report was generated.  [employer] will not be held responsible for the validity of the ICD and CPT codes included on this report.  AMA assumes no liability for data contained or not contained herein. CPT is a Publishing rights managerregistered trademark of the [employer].  PATIENT NAME:  Theodore Smith, Theodore Smith MR#: [id number]

## 2014-07-21 NOTE — Interval H&P Note (Signed)
History and Physical Interval Note:  07/21/2014 10:02 AM  Theodore Smith  has presented today for surgery, with the diagnosis of screening  The various methods of treatment have been discussed with the patient and family. After consideration of risks, benefits and other options for treatment, the patient has consented to  Procedure(s) with comments: COLONOSCOPY WITH PROPOFOL (N/A) - 1245 as a surgical intervention .  The patient's history has been reviewed, patient examined, no change in status, stable for surgery.  I have reviewed the patient's chart and labs.  Questions were answered to the patient's satisfaction.     [name]  No change. Patient here for screening colonoscopy.The risks, benefits, limitations, alternatives and imponderables have been reviewed with the patient. Questions have been answered. All parties are agreeable.

## 2014-07-21 NOTE — Interval H&P Note (Signed)
History and Physical Interval Note:  07/21/2014 9:55 AM  Theodore Smith  has presented today for surgery, with the diagnosis of screening  The various methods of treatment have been discussed with the patient and family. After consideration of risks, benefits and other options for treatment, the patient has consented to  Procedure(s) with comments: COLONOSCOPY WITH PROPOFOL (N/A) - 1245 as a surgical intervention .  The patient's history has been reviewed, patient examined, no change in status, stable for surgery.  I have reviewed the patient's chart and labs.  Questions were answered to the patient's satisfaction.     [name]   No change. Patient here for first ever average risk screening colonoscopy.  The risks, benefits, limitations, alternatives and imponderables have been reviewed with the patient. Questions have been answered. All parties are agreeable.

## 2014-07-21 NOTE — Transfer of Care (Signed)
Immediate Anesthesia Transfer of Care Note  Patient: Theodore Smith  Procedure(s) Performed: Procedure(s) with comments: COLONOSCOPY WITH PROPOFOL (N/A) - Cecum time in  1027  time out  1039    total time 12 minutes POLYPECTOMY (N/A) - rectal  Patient Location: PACU  Anesthesia Type:MAC  Level of Consciousness: sedated  Airway & Oxygen Therapy: Patient Spontanous Breathing and Patient connected to face mask oxygen  Post-op Assessment: Report given to RN, Post -op Vital signs reviewed and stable and Patient moving all extremities  Post vital signs: Reviewed and stable  Last Vitals:  Filed Vitals:   07/21/14 1005  BP: 118/83  Pulse:   Temp:   Resp: 18    Complications: No apparent anesthesia complications

## 2014-07-21 NOTE — Discharge Instructions (Signed)
Colonoscopy Discharge Instructions  Read the instructions outlined below and refer to this sheet in the next few weeks. These discharge instructions provide you with general information on caring for yourself after you leave the hospital. Your doctor may also give you specific instructions. While your treatment has been planned according to the most current medical practices available, unavoidable complications occasionally occur. If you have any problems or questions after discharge, call Dr. Jena Gaussourk at [phone]. ACTIVITY  You may resume your regular activity, but move at a slower pace for the next 24 hours.   Take frequent rest periods for the next 24 hours.   Walking will help get rid of the air and reduce the bloated feeling in your belly (abdomen).   No driving for 24 hours (because of the medicine (anesthesia) used during the test).    Do not sign any important legal documents or operate any machinery for 24 hours (because of the anesthesia used during the test).  NUTRITION  Drink plenty of fluids.   You may resume your normal diet as instructed by your doctor.   Begin with a light meal and progress to your normal diet. Heavy or fried foods are harder to digest and may make you feel sick to your stomach (nauseated).   Avoid alcoholic beverages for 24 hours or as instructed.  MEDICATIONS  You may resume your normal medications unless your doctor tells you otherwise.  WHAT YOU CAN EXPECT TODAY  Some feelings of bloating in the abdomen.   Passage of more gas than usual.   Spotting of blood in your stool or on the toilet paper.  IF YOU HAD POLYPS REMOVED DURING THE COLONOSCOPY:  No aspirin products for 7 days or as instructed.   No alcohol for 7 days or as instructed.   Eat a soft diet for the next 24 hours.  FINDING OUT THE RESULTS OF YOUR TEST Not all test results are available during your visit. If your test results are not back during the visit, make an appointment  with your caregiver to find out the results. Do not assume everything is normal if you have not heard from your caregiver or the medical facility. It is important for you to follow up on all of your test results.  SEEK IMMEDIATE MEDICAL ATTENTION IF:  You have more than a spotting of blood in your stool.   Your belly is swollen (abdominal distention).   You are nauseated or vomiting.   You have a temperature over 101.   You have abdominal pain or discomfort that is severe or gets worse throughout the day.    Polyp information provided  Colonoscopy today was inadequate because of poor preparation.  Further recommendations to follow.   Colon Polyps Polyps are lumps of extra tissue growing inside the body. Polyps can grow in the large intestine (colon). Most colon polyps are noncancerous (benign). However, some colon polyps can become cancerous over time. Polyps that are larger than a pea may be harmful. To be safe, caregivers remove and test all polyps. CAUSES  Polyps form when mutations in the genes cause your cells to grow and divide even though no more tissue is needed. RISK FACTORS There are a number of risk factors that can increase your chances of getting colon polyps. They include:  Being older than 50 years.  Family history of colon polyps or colon cancer.  Long-term colon diseases, such as colitis or Crohn disease.  Being overweight.  Smoking.  Being inactive.  Drinking too much alcohol. SYMPTOMS  Most small polyps do not cause symptoms. If symptoms are present, they may include:  Blood in the stool. The stool may look dark red or black.  Constipation or diarrhea that lasts longer than 1 week. DIAGNOSIS People often do not know they have polyps until their caregiver finds them during a regular checkup. Your caregiver can use 4 tests to check for polyps:  Digital rectal exam. The caregiver wears gloves and feels inside the rectum. This test would find polyps  only in the rectum.  Barium enema. The caregiver puts a liquid called barium into your rectum before taking X-rays of your colon. Barium makes your colon look white. Polyps are dark, so they are easy to see in the X-ray pictures.  Sigmoidoscopy. A thin, flexible tube (sigmoidoscope) is placed into your rectum. The sigmoidoscope has a light and tiny camera in it. The caregiver uses the sigmoidoscope to look at the last third of your colon.  Colonoscopy. This test is like sigmoidoscopy, but the caregiver looks at the entire colon. This is the most common method for finding and removing polyps. TREATMENT  Any polyps will be removed during a sigmoidoscopy or colonoscopy. The polyps are then tested for cancer. PREVENTION  To help lower your risk of getting more colon polyps:  Eat plenty of fruits and vegetables. Avoid eating fatty foods.  Do not smoke.  Avoid drinking alcohol.  Exercise every day.  Lose weight if recommended by your caregiver.  Eat plenty of calcium and folate. Foods that are rich in calcium include milk, cheese, and broccoli. Foods that are rich in folate include chickpeas, kidney beans, and spinach. HOME CARE INSTRUCTIONS Keep all follow-up appointments as directed by your caregiver. You may need periodic exams to check for polyps. SEEK MEDICAL CARE IF: You notice bleeding during a bowel movement. Document Released: 11/01/2003 Document Revised: 04/29/2011 Document Reviewed: 04/16/2011 [facility] Patient Information 2015 [place], [place]. This information is not intended to replace advice given to you by your health care provider. Make sure you discuss any questions you have with your health care provider.

## 2014-07-21 NOTE — Anesthesia Postprocedure Evaluation (Signed)
  Anesthesia Post-op Note  Patient: Theodore Smith  Procedure(s) Performed: Procedure(s) with comments: COLONOSCOPY WITH PROPOFOL (N/A) - Cecum time in  1027  time out  1039    total time 12 minutes POLYPECTOMY (N/A) - rectal  Patient Location: PACU  Anesthesia Type:MAC  Level of Consciousness: awake, alert , oriented and patient cooperative  Airway and Oxygen Therapy: Patient Spontanous Breathing  Post-op Pain: none  Post-op Assessment: Post-op Vital signs reviewed, Patient's Cardiovascular Status Stable, Respiratory Function Stable, Patent Airway and No signs of Nausea or vomiting  Post-op Vital Signs: Reviewed and stable  Last Vitals:  Filed Vitals:   07/21/14 1005  BP: 118/83  Pulse:   Temp:   Resp: 18    Complications: No apparent anesthesia complications

## 2014-07-21 NOTE — H&P (View-Only) (Signed)
Primary Care Physician:  Colon BranchQURESHI, AYYAZ, MD Primary Gastroenterologist:  Dr. Jena Gaussourk  Chief Complaint  Patient presents with  . Colonoscopy    HPI:   59 year old male presents on referral from PCP for screening colonoscopy. PCP notes reviewed. Patient was seen and noted to have some weight loss. No history of colonoscopy can be found in the system or in a printed notes. Patient is a history of schizophrenia and is accompanied by her caregiver was taken over the home as of 2015. Patient is able to sign for himself and understand most information. Scheduled for office visit due to polypharmacy. Office visit was assisted by information from a caregiver as well as the patient.  Today he states he is having unintentional weight loss. Has never had a colonoscopy. Denies abdominal pain, N/V. Denies hematochezia and melena. Denies fever, chills, syncope, near syncope. Describes occasional light-headedness which he attributes to his psych medications. Has a bowel movement every day to every two days. Denies constipation and diarrhea. Denies any other upper or lower GI symptoms.  Past Medical History  Diagnosis Date  . Hyperlipidemia   . GERD (gastroesophageal reflux disease)   . Schizophrenia     disorganized  . Agitation     No past surgical history on file.  Current Outpatient Prescriptions  Medication Sig Dispense Refill  . benztropine (COGENTIN) 0.5 MG tablet Take 1 tablet (0.5 mg total) by mouth 2 (two) times daily. 60 tablet 2  . chlorproMAZINE (THORAZINE) 200 MG tablet Takes one tablet in the am, one at noon and 4 at bedtime 180 tablet 2  . cholecalciferol (VITAMIN D) 1000 UNITS tablet Take 2,000 Units by mouth 2 (two) times daily.     Marland Kitchen. docusate sodium (COLACE) 100 MG capsule Take 100 mg by mouth 2 (two) times daily.    Marland Kitchen. lactulose (CHRONULAC) 10 GM/15ML solution Take 15 g by mouth daily.    . Linaclotide (LINZESS) 145 MCG CAPS capsule Take 145 mcg by mouth daily.    Marland Kitchen. loratadine  (CLARITIN) 10 MG tablet Take 10 mg by mouth daily.    Marland Kitchen. LORazepam (ATIVAN) 2 MG tablet Take one half tablet twice a day and one tablet at bedtime 60 tablet 2  . omeprazole (PRILOSEC) 20 MG capsule Take 20 mg by mouth daily.    . risperidone (RISPERDAL) 4 MG tablet Take 1 tablet (4 mg total) by mouth 2 (two) times daily. 60 tablet 2   No current facility-administered medications for this visit.    Allergies as of 07/04/2014  . (No Known Allergies)    Family History  Problem Relation Age of Onset  . Alcohol abuse Brother   . Anxiety disorder Brother   . Depression Brother   . Drug abuse Brother   . Mental illness Other   . ADD / ADHD Other   . Anxiety disorder Other   . Depression Other   . Drug abuse Other   . Bipolar disorder Neg Hx   . Dementia Neg Hx   . OCD Neg Hx   . Paranoid behavior Neg Hx   . Schizophrenia Neg Hx   . Seizures Neg Hx   . Sexual abuse Neg Hx   . Physical abuse Neg Hx     History   Social History  . Marital Status: Single    Spouse Name: N/A  . Number of Children: N/A  . Years of Education: N/A   Occupational History  . Not on file.   Social History  Main Topics  . Smoking status: Current Every Day Smoker -- 2.00 packs/day    Types: Cigarettes  . Smokeless tobacco: Not on file     Comment: one to one and a half packs daily  . Alcohol Use: No  . Drug Use: No  . Sexual Activity: Not on file   Other Topics Concern  . Not on file   Social History Narrative    Review of Systems: General: Negative for anorexia, fever, chills, fatigue, weakness. Admits weight loss.  Eyes: Negative for vision changes.  ENT: Negative for hoarseness, difficulty swallowing. CV: Negative for chest pain, angina, palpitations, peripheral edema.  Respiratory: Negative for dyspnea at rest, cough, wheezing.  GI: See history of present illness MS: Negative for joint pain, low back pain.  Derm: Negative for rash or itching.  Psych: Negative for anxiety, depression.   Endo: Admits weight loss.  Heme: Negative for bruising or bleeding. Allergy: Negative for rash or hives.    Physical Exam: BP 87/59 mmHg  Pulse 94  Temp(Src) 97.4 F (36.3 C) (Oral)  Ht 5\' 7"  (1.702 m)  Wt 160 lb 9.6 oz (72.848 kg)  BMI 25.15 kg/m2 General:   Alert and oriented. Pleasant and cooperative. Well-nourished and well-developed.  Head:  Normocephalic and atraumatic. Eyes:  Without icterus, sclera clear and conjunctiva pink.  Ears:  Normal auditory acuity. Mouth:  No deformity or lesions, oral mucosa pink. No OP edema. Neck:  Supple, without mass or thyromegaly. Lungs:  Clear to auscultation bilaterally. No wheezes, rales, or rhonchi. No distress.  Heart:  S1, S2 present without murmurs appreciated.  Abdomen:  +BS, soft, non-tender and non-distended. No HSM noted. No guarding or rebound. No masses appreciated.  Rectal:  Deferred  Msk:  Symmetrical without gross deformities. Normal posture. Extremities:  Without clubbing or edema. Neurologic:  Alert and  oriented x4;  grossly normal neurologically. Skin:  Intact without significant lesions or rashes. Psych:  Alert and cooperative. Normal mood. Speech difficult to understand, flat affect.     07/04/2014 10:54 AM

## 2014-07-21 NOTE — Anesthesia Preprocedure Evaluation (Signed)
Anesthesia Evaluation  Patient identified by MRN, date of birth, ID band Patient awake    Reviewed: Allergy & Precautions, NPO status , Patient's Chart, lab work & pertinent test results  Airway Mallampati: II  TM Distance: >3 FB Neck ROM: Full    Dental  (+) Edentulous Upper, Edentulous Lower   Pulmonary COPDCurrent Smoker,  breath sounds clear to auscultation        Cardiovascular negative cardio ROS  Rhythm:Regular Rate:Normal     Neuro/Psych PSYCHIATRIC DISORDERS Schizophrenia    GI/Hepatic GERD-  Medicated,  Endo/Other    Renal/GU      Musculoskeletal   Abdominal   Peds  Hematology   Anesthesia Other Findings   Reproductive/Obstetrics                             Anesthesia Physical Anesthesia Plan  ASA: III  Anesthesia Plan: MAC   Post-op Pain Management:    Induction: Intravenous  Airway Management Planned: Simple Face Mask  Additional Equipment:   Intra-op Plan:   Post-operative Plan:   Informed Consent: I have reviewed the patients History and Physical, chart, labs and discussed the procedure including the risks, benefits and alternatives for the proposed anesthesia with the patient or authorized representative who has indicated his/her understanding and acceptance.     Plan Discussed with:   Anesthesia Plan Comments:         Anesthesia Quick Evaluation

## 2014-07-22 ENCOUNTER — Encounter (HOSPITAL_COMMUNITY): Payer: Self-pay | Admitting: Internal Medicine

## 2014-07-23 ENCOUNTER — Encounter: Payer: Self-pay | Admitting: Internal Medicine

## 2014-07-26 ENCOUNTER — Telehealth: Payer: Self-pay

## 2014-07-26 NOTE — Telephone Encounter (Signed)
Letter mailed to the pt. 

## 2014-07-26 NOTE — Telephone Encounter (Signed)
Per RMR-  Send letter to patient.  Send copy of letter with path to referring provider and PCP. Will need an ov in 1 yr to help get a better prep

## 2014-07-26 NOTE — Telephone Encounter (Signed)
Recall made 

## 2014-08-02 ENCOUNTER — Ambulatory Visit (INDEPENDENT_AMBULATORY_CARE_PROVIDER_SITE_OTHER): Payer: Medicare Other | Admitting: Psychiatry

## 2014-08-02 ENCOUNTER — Encounter (HOSPITAL_COMMUNITY): Payer: Self-pay | Admitting: Psychiatry

## 2014-08-02 VITALS — BP 108/68 | HR 89 | Ht 67.0 in | Wt 160.6 lb

## 2014-08-02 DIAGNOSIS — F209 Schizophrenia, unspecified: Secondary | ICD-10-CM | POA: Diagnosis not present

## 2014-08-02 DIAGNOSIS — Z720 Tobacco use: Secondary | ICD-10-CM | POA: Diagnosis not present

## 2014-08-02 DIAGNOSIS — F201 Disorganized schizophrenia: Secondary | ICD-10-CM

## 2014-08-02 DIAGNOSIS — F200 Paranoid schizophrenia: Secondary | ICD-10-CM

## 2014-08-02 DIAGNOSIS — F17200 Nicotine dependence, unspecified, uncomplicated: Secondary | ICD-10-CM | POA: Diagnosis not present

## 2014-08-02 DIAGNOSIS — K219 Gastro-esophageal reflux disease without esophagitis: Secondary | ICD-10-CM | POA: Diagnosis not present

## 2014-08-02 MED ORDER — CHLORPROMAZINE HCL 200 MG PO TABS: ORAL_TABLET | ORAL | Status: DC

## 2014-08-02 MED ORDER — BENZTROPINE MESYLATE 0.5 MG PO TABS: 0.5000 mg | ORAL_TABLET | Freq: Two times a day (BID) | ORAL | Status: DC

## 2014-08-02 MED ORDER — RISPERIDONE 4 MG PO TABS
4.0000 mg | ORAL_TABLET | Freq: Two times a day (BID) | ORAL | Status: DC
Start: 2014-08-02 — End: 2014-12-01

## 2014-08-02 MED ORDER — LORAZEPAM 2 MG PO TABS: ORAL_TABLET | ORAL | Status: DC

## 2014-08-02 NOTE — Progress Notes (Signed)
Patient ID: Theodore Smith, male   DOB: 1955-07-19, 59 y.o.   MRN: [id number] Patient ID: Theodore Smith, male   DOB: [birth year], 59 y.o.   MRN: [id number] Patient ID: Theodore Smith, male   DOB: [birth year], 59 y.o.   MRN: [id number] Patient ID: Theodore Smith, male   DOB: 04-20-1955, 59 y.o.   MRN: [id number] Patient ID: Theodore Smith, male   DOB: Oct 03, 1955, 59 y.o.   MRN: [id number] Patient ID: Theodore Smith, male   DOB: 10-Nov-1955, 59 y.o.   MRN: [id number] Patient ID: Theodore Smith, male   DOB: 04-18-1955, 59 y.o.   MRN: [id number] [facility] [id number] Progress Note Theodore Smith MRN: [id number] DOB: June 16, 1955 Age: 59 y.o.  Date: 08/02/2014   Chief Complaint  Patient presents with  . Paranoid   History of presenting illness Patient is a 59 year old [place] American male who came for his followup appointment with the staff of [facility]. He has been living in a rest home for about 30 years.  Returns after 4 months with his caregiver from the rest home. He is calm today. He is somewhat difficult to understand because all his teeth have been pulled. And he tends to ramble and talk to himself. His caregiver states that he has been stable. He had gone through a period of weight loss but he is weight remains around the 160 pounds for the last 3 months. It could be so this was related to having his teeth pulled out. He's had a negative colonoscopy in his labs are normal. He seems to be in a good mood most of the time although his thoughts are quite disorganized. He's not had any periods of agitation or violence  Past psychiatric history Patient has significant history of schizophrenia disorganized type.  [facility]  He do not recall the details of these admission however since 1999 he has been not admitted to psychiatric hospital.  He seen in this office since 1999 and has been managed on 2 antipsychotic medication.  Patient do  not recall if he ever had any suicidal attempt.  Medical history Hyperlipidemia and GERD vitamin D deficiency IBS  Family History family history includes ADD / ADHD in his other; Alcohol abuse in his brother; Anxiety disorder in his brother and other; Depression in his brother and other; Drug abuse in his brother and other; Mental illness in his other. There is no history of Bipolar disorder, Dementia, OCD, Paranoid behavior, Schizophrenia, Seizures, Sexual abuse, Physical abuse, or Colon cancer.  Mental status examination Patient is fairly groomed and dressed.t He maintained limited eye contact.  His speech is incoherent and his thought processes are illogical. He is fairly cooperative and did not show any signs of agitation or anger but his attention and concentration is poor. He was  able to answer questions more appropriately today . He is counseling monitoring and talking to himself  He denies any active or passive suicidal thinking. He is cooperative. He has no extrapyramidal side effects noted. He is alert . His insight judgment is poor and impulse control is fair. His speech is garbled fund of knowledge is poor and memory and recall are also poor Lab Results:  Results for orders placed or performed during the hospital encounter of 07/19/14 (from the past 2016 hour(s))  CBC with Differential/Platelet   Collection Time: 07/19/14 11:15 AM  Result Value Ref Range   WBC 7.5 4.0 -  10.5 K/uL   RBC 4.97 4.22 - 5.81 MIL/uL   Hemoglobin 14.8 13.0 - 17.0 g/dL   HCT 55.9 74.1 - 63.8 %   MCV 88.1 78.0 - 100.0 fL   MCH 29.8 26.0 - 34.0 pg   MCHC 33.8 30.0 - 36.0 g/dL   RDW 45.3 64.6 - 80.3 %   Platelets 236 150 - 400 K/uL   Neutrophils Relative % 67 43 - 77 %   Neutro Abs 5.0 1.7 - 7.7 K/uL   Lymphocytes Relative 24 12 - 46 %   Lymphs Abs 1.8 0.7 - 4.0 K/uL   Monocytes Relative 7 3 - 12 %   Monocytes Absolute 0.5 0.1 - 1.0 K/uL   Eosinophils Relative 2 0 - 5 %   Eosinophils Absolute 0.1 0.0  - 0.7 K/uL   Basophils Relative 0 0 - 1 %   Basophils Absolute 0.0 0.0 - 0.1 K/uL  Basic metabolic panel   Collection Time: 07/19/14 11:15 AM  Result Value Ref Range   Sodium 139 135 - 145 mmol/L   Potassium 4.2 3.5 - 5.1 mmol/L   Chloride 106 101 - 111 mmol/L   CO2 22 22 - 32 mmol/L   Glucose, Bld 97 65 - 99 mg/dL   BUN 13 6 - 20 mg/dL   Creatinine, Ser 2.12 0.61 - 1.24 mg/dL   Calcium 9.4 8.9 - 24.8 mg/dL   GFR calc non Af Amer >60 >60 mL/min   GFR calc Af Amer >60 >60 mL/min   Anion gap 11 5 - 15   PCP draws routine labs and nothing is emerging as of concern.  Assessment. Axis I Schizophrenia chronic disorganized type Axis II deferred Axis III hyperlipidemia and GERD Axis IV moderate Axis V 45  Plan/Discussion: He will continue his current dosages of Risperda and Thorazine for schizophrenia and Cogentin for side effects from and psychotic His Ativan will be continued at 1 mg twice a day and 2 mg at bedtime for anxiety.  Followup in 4 months. Staff can call us at anytime if his agitation worsens. He'll return in 3 months  MEDICATIONS this encounter: Meds ordered this encounter  Medications  . benztropine (COGENTIN) 0.5 MG tablet    Sig: Take 1 tablet (0.5 mg total) by mouth 2 (two) times daily.    Dispense:  60 tablet    Refill:  2  . risperidone (RISPERDAL) 4 MG tablet    Sig: Take 1 tablet (4 mg total) by mouth 2 (two) times daily.    Dispense:  60 tablet    Refill:  2  . chlorproMAZINE (THORAZINE) 200 MG tablet    Sig: Takes one tablet in the am, one at noon and 4 at bedtime    Dispense:  180 tablet    Refill:  2  . LORazepam (ATIVAN) 2 MG tablet    Sig: Take one half tablet twice a day and one tablet at bedtime    Dispense:  60 tablet    Refill:  2    Medical Decision Making Problem Points:  Established problem, stable/improving (1), Review of last therapy session (1) and Review of psycho-social stressors (1) Data Points:  Review or order clinical lab tests  (1) Review of medication regiment & side effects (2)  Sheridyn Canino, MD

## 2014-09-13 DIAGNOSIS — I10 Essential (primary) hypertension: Secondary | ICD-10-CM | POA: Diagnosis not present

## 2014-09-13 DIAGNOSIS — J439 Emphysema, unspecified: Secondary | ICD-10-CM | POA: Diagnosis not present

## 2014-09-13 DIAGNOSIS — I9589 Other hypotension: Secondary | ICD-10-CM | POA: Diagnosis not present

## 2014-09-13 DIAGNOSIS — R200 Anesthesia of skin: Secondary | ICD-10-CM | POA: Diagnosis not present

## 2014-09-13 DIAGNOSIS — E860 Dehydration: Secondary | ICD-10-CM | POA: Diagnosis not present

## 2014-09-13 DIAGNOSIS — Z7982 Long term (current) use of aspirin: Secondary | ICD-10-CM | POA: Diagnosis not present

## 2014-09-13 DIAGNOSIS — F17200 Nicotine dependence, unspecified, uncomplicated: Secondary | ICD-10-CM | POA: Diagnosis not present

## 2014-09-13 DIAGNOSIS — E869 Volume depletion, unspecified: Secondary | ICD-10-CM | POA: Diagnosis not present

## 2014-09-13 DIAGNOSIS — F209 Schizophrenia, unspecified: Secondary | ICD-10-CM | POA: Diagnosis not present

## 2014-09-14 DIAGNOSIS — E860 Dehydration: Secondary | ICD-10-CM | POA: Diagnosis not present

## 2014-09-14 DIAGNOSIS — E869 Volume depletion, unspecified: Secondary | ICD-10-CM | POA: Diagnosis not present

## 2014-09-14 DIAGNOSIS — I10 Essential (primary) hypertension: Secondary | ICD-10-CM | POA: Diagnosis not present

## 2014-09-14 DIAGNOSIS — F209 Schizophrenia, unspecified: Secondary | ICD-10-CM | POA: Diagnosis not present

## 2014-09-14 DIAGNOSIS — R200 Anesthesia of skin: Secondary | ICD-10-CM | POA: Diagnosis not present

## 2014-09-21 ENCOUNTER — Other Ambulatory Visit (HOSPITAL_COMMUNITY): Payer: Self-pay | Admitting: Psychiatry

## 2014-09-22 DIAGNOSIS — K5900 Constipation, unspecified: Secondary | ICD-10-CM | POA: Diagnosis not present

## 2014-09-22 DIAGNOSIS — F209 Schizophrenia, unspecified: Secondary | ICD-10-CM | POA: Diagnosis not present

## 2014-09-22 DIAGNOSIS — K219 Gastro-esophageal reflux disease without esophagitis: Secondary | ICD-10-CM | POA: Diagnosis not present

## 2014-09-22 DIAGNOSIS — E669 Obesity, unspecified: Secondary | ICD-10-CM | POA: Diagnosis not present

## 2014-09-22 DIAGNOSIS — F17200 Nicotine dependence, unspecified, uncomplicated: Secondary | ICD-10-CM | POA: Diagnosis not present

## 2014-11-17 DIAGNOSIS — Z23 Encounter for immunization: Secondary | ICD-10-CM | POA: Diagnosis not present

## 2014-12-01 ENCOUNTER — Ambulatory Visit (INDEPENDENT_AMBULATORY_CARE_PROVIDER_SITE_OTHER): Payer: Medicare Other | Admitting: Psychiatry

## 2014-12-01 ENCOUNTER — Encounter (HOSPITAL_COMMUNITY): Payer: Self-pay | Admitting: Psychiatry

## 2014-12-01 VITALS — BP 122/78 | HR 92 | Ht 67.0 in | Wt 151.8 lb

## 2014-12-01 DIAGNOSIS — F201 Disorganized schizophrenia: Secondary | ICD-10-CM

## 2014-12-01 DIAGNOSIS — F200 Paranoid schizophrenia: Secondary | ICD-10-CM

## 2014-12-01 MED ORDER — CHLORPROMAZINE HCL 200 MG PO TABS: ORAL_TABLET | ORAL | Status: DC

## 2014-12-01 MED ORDER — LORAZEPAM 2 MG PO TABS: ORAL_TABLET | ORAL | Status: DC

## 2014-12-01 MED ORDER — RISPERIDONE 4 MG PO TABS: 4.0000 mg | ORAL_TABLET | Freq: Two times a day (BID) | ORAL | Status: DC

## 2014-12-01 MED ORDER — BENZTROPINE MESYLATE 0.5 MG PO TABS: 0.5000 mg | ORAL_TABLET | Freq: Two times a day (BID) | ORAL | Status: DC

## 2014-12-01 NOTE — Progress Notes (Signed)
Patient ID: Theodore Smith, male   DOB: 1955-04-25, 59 y.o.   MRN: [id number] Patient ID: Theodore Smith, male   DOB: 1955-05-23, 59 y.o.   MRN: [id number] Patient ID: Theodore Smith, male   DOB: Jun 20, 1955, 59 y.o.   MRN: [id number] Patient ID: Theodore Smith, male   DOB: [birth year], 59 y.o.   MRN: [id number] Patient ID: Theodore Smith, male   DOB: Jan 24, 1956, 59 y.o.   MRN: [id number] Patient ID: Theodore Smith, male   DOB: 1955-12-07, 59 y.o.   MRN: [id number] Patient ID: Theodore Smith, male   DOB: [birth year], 59 y.o.   MRN: [id number] Patient ID: Theodore Smith, male   DOB: March 06, 1955, 59 y.o.   MRN: [id number] [facility] [address] Progress Note Theodore Smith MRN: [id number] DOB: October 20, 1955 Age: 59 y.o.  Date: 12/01/2014   Chief Complaint  Patient presents with  . Schizophrenia  . Follow-up   History of presenting illness Patient is a 59 year old [place] American male who came for his followup appointment with the staff of [facility]. He has been living in a rest home for about 30 years.  Returns after 3 months with his caregiver from the rest home. He is calm today. He is somewhat difficult to understand because all his teeth have been pulled. And he tends to ramble and talk to himself. His caregiver states that he has been stable. He is not sleeping that well but he is allowed to sleep during the day so his days and nights are somewhat backwards. He continues to lose weight and this is been worked up by GI. He had a colonoscopy in June but it was inadequate because he wasn't totally cleaned out. According to the caregiver he eats fairly well. I explained to her that if he is allowed [place] sleep through the day he is not going to sleep at night and he needs to get more exercise rather than be allowed to sit around all day Past psychiatric history Patient has significant history of schizophrenia disorganized type.  He [facility].  He  do not recall the details of these admission however since 1999 he has been not admitted to psychiatric hospital.  He seen in this office since 1999 and has been managed on 2 antipsychotic medication.  Patient do not recall if he ever had any suicidal attempt.  Medical history Hyperlipidemia and GERD vitamin D deficiency IBS  Family History family history includes ADD / ADHD in his other; Alcohol abuse in his brother; Anxiety disorder in his brother and other; Depression in his brother and other; Drug abuse in his brother and other; Mental illness in his other. There is no history of Bipolar disorder, Dementia, OCD, Paranoid behavior, Schizophrenia, Seizures, Sexual abuse, Physical abuse, or Colon cancer.  Mental status examination Patient is fairly groomed and dressed.t He maintained limited eye contact.  His speech is incoherent and his thought processes are illogical. He is fairly cooperative and did not show any signs of agitation or anger but his attention and concentration is poor. He was  able to answer questions more appropriately today . He is  talking to himself  He denies any active or passive suicidal thinking. He is cooperative. He has no extrapyramidal side effects noted. He is alert . His insight judgment is poor and impulse control is fair. His speech is garbled fund of knowledge is poor and memory and recall are also poor  Lab Results:  No results found for this or any previous visit (from the past 2016 hour(s)). PCP draws routine labs and nothing is emerging as of concern.  Assessment. Axis I Schizophrenia chronic disorganized type Axis II deferred Axis III hyperlipidemia and GERD Axis IV moderate Axis V 45  Plan/Discussion: He will continue his current dosages of Risperda and Thorazine for schizophrenia and Cogentin for side effects from and psychotic His Ativan will be continued at 1 mg twice a day and 2 mg at bedtime for anxiety.  Followup in 3 months. Staff can call us at  anytime if his agitation worsens. He'll return in 3 months  MEDICATIONS this encounter: Meds ordered this encounter  Medications  . risperidone (RISPERDAL) 4 MG tablet    Sig: Take 1 tablet (4 mg total) by mouth 2 (two) times daily.    Dispense:  60 tablet    Refill:  2  . benztropine (COGENTIN) 0.5 MG tablet    Sig: Take 1 tablet (0.5 mg total) by mouth 2 (two) times daily.    Dispense:  60 tablet    Refill:  2  . chlorproMAZINE (THORAZINE) 200 MG tablet    Sig: Takes one tablet in the am, one at noon and 4 at bedtime    Dispense:  180 tablet    Refill:  2  . LORazepam (ATIVAN) 2 MG tablet    Sig: Take one half tablet twice a day and one tablet at bedtime    Dispense:  60 tablet    Refill:  2    Medical Decision Making Problem Points:  Established problem, stable/improving (1), Review of last therapy session (1) and Review of psycho-social stressors (1) Data Points:  Review or order clinical lab tests (1) Review of medication regiment & side effects (2)  ROSS, DEBORAH, MD

## 2014-12-20 ENCOUNTER — Other Ambulatory Visit (HOSPITAL_COMMUNITY): Payer: Self-pay | Admitting: Psychiatry

## 2014-12-29 DIAGNOSIS — K219 Gastro-esophageal reflux disease without esophagitis: Secondary | ICD-10-CM | POA: Diagnosis not present

## 2014-12-29 DIAGNOSIS — F17200 Nicotine dependence, unspecified, uncomplicated: Secondary | ICD-10-CM | POA: Diagnosis not present

## 2014-12-29 DIAGNOSIS — F209 Schizophrenia, unspecified: Secondary | ICD-10-CM | POA: Diagnosis not present

## 2015-03-02 ENCOUNTER — Encounter (HOSPITAL_COMMUNITY): Payer: Self-pay | Admitting: Psychiatry

## 2015-03-02 ENCOUNTER — Ambulatory Visit (INDEPENDENT_AMBULATORY_CARE_PROVIDER_SITE_OTHER): Payer: Medicare Other | Admitting: Psychiatry

## 2015-03-02 VITALS — BP 97/58 | HR 96 | Ht 67.0 in | Wt 160.4 lb

## 2015-03-02 DIAGNOSIS — F200 Paranoid schizophrenia: Secondary | ICD-10-CM

## 2015-03-02 DIAGNOSIS — F201 Disorganized schizophrenia: Secondary | ICD-10-CM

## 2015-03-02 MED ORDER — CHLORPROMAZINE HCL 200 MG PO TABS: ORAL_TABLET | ORAL | Status: DC

## 2015-03-02 MED ORDER — BENZTROPINE MESYLATE 0.5 MG PO TABS
0.5000 mg | ORAL_TABLET | Freq: Two times a day (BID) | ORAL | Status: DC
Start: 2015-03-02 — End: 2015-05-30

## 2015-03-02 MED ORDER — RISPERIDONE 4 MG PO TABS
4.0000 mg | ORAL_TABLET | Freq: Two times a day (BID) | ORAL | Status: DC
Start: 2015-03-02 — End: 2015-05-30

## 2015-03-02 MED ORDER — LORAZEPAM 2 MG PO TABS: ORAL_TABLET | ORAL | Status: DC

## 2015-03-02 NOTE — Progress Notes (Signed)
Patient ID: Theodore Smith, male   DOB: [birth year], 60 y.o.   MRN: [id number] Patient ID: Theodore Smith, male   DOB: [birth year], 60 y.o.   MRN: [id number] Patient ID: Theodore Smith, male   DOB: 1955-08-16, 60 y.o.   MRN: [id number] Patient ID: Theodore Smith, male   DOB: [birth year], 60 y.o.   MRN: [id number] Patient ID: Theodore Smith, male   DOB: [birth year], 60 y.o.   MRN: [id number] Patient ID: Theodore Smith, male   DOB: 1955/03/22, 60 y.o.   MRN: [id number] Patient ID: Theodore Smith, male   DOB: 02-21-1955, 60 y.o.   MRN: [id number] Patient ID: Theodore Smith, male   DOB: February 20, 1955, 60 y.o.   MRN: [id number] Patient ID: Theodore Smith, male   DOB: [birth year], 60 y.o.   MRN: [id number] [facility] [address] Progress Note Theodore Smith MRN: [id number] DOB: 1955-12-10 Age: 60 y.o.  Date: 03/02/2015   Chief Complaint  Patient presents with  . Schizophrenia  . Follow-up   History of presenting illness Patient is a 60 year old [place] American male who came for his followup appointment with the staff of [facility]. He has been living in a rest home for about 30 years.  Returns after 3 months with his caregiver from the rest home. He is calm today. He is somewhat difficult to understand because all his teeth have been pulled. However he answers questions appropriately. He was able to tell me his sisters came to visit for Christmas. He is now used to not having teeth and is eating better. He refuses to wears dentures. He is up to 160 pounds and he has gained about 10 pounds since October. He is much calmer than he has been in the past. I explained to his caregiver that we need to check some labs-she will go been A1c, basic metabolic panel and lipid panel and they will have this done with Dr. Felecia Shelling  Past psychiatric history Patient has significant history of schizophrenia disorganized type.  He [facility].  He do not recall the details of  these admission however since 1999 he has been not admitted to psychiatric hospital.  He seen in this office since 1999 and has been managed on 2 antipsychotic medication.  Patient do not recall if he ever had any suicidal attempt.  Medical history Hyperlipidemia and GERD vitamin D deficiency IBS  Family History family history includes ADD / ADHD in his other; Alcohol abuse in his brother; Anxiety disorder in his brother and other; Depression in his brother and other; Drug abuse in his brother and other; Mental illness in his other. There is no history of Bipolar disorder, Dementia, OCD, Paranoid behavior, Schizophrenia, Seizures, Sexual abuse, Physical abuse, or Colon cancer.  Mental status examination Patient is fairly groomed and dressed.t He maintained limited eye contact.  His speech is poorly articulated but he was more coherent and he has been in previous visits. He is fairly cooperative and did not show any signs of agitation or anger but his attention and concentration is poor. He was  able to answer questions more appropriately today . He is  talking to himself  He denies any active or passive suicidal thinking. He is cooperative. He has no extrapyramidal side effects noted. He is alert . His insight judgment is poor and impulse control is fair. His speech is garbled fund of knowledge is poor and memory and recall are also  poor Lab Results:  No results found for this or any previous visit (from the past 2016 hour(s)). PCP draws routine labs and nothing is emerging as of concern.  Assessment. Axis I Schizophrenia chronic disorganized type Axis II deferred Axis III hyperlipidemia and GERD Axis IV moderate Axis V 45  Plan/Discussion: He will continue his current dosages of Risperda and Thorazine for schizophrenia and Cogentin for side effects from and psychotic His Ativan will be continued at 1 mg twice a day and 2 mg at bedtime for anxiety. We'll ask his PCP to draw Hemoccult an A1c  lipid panel and a sig metabolic panel and fax us a result Followup in 3 months. Staff can call us at anytime if his agitation worsens. He'll return in 3 months  MEDICATIONS this encounter: Meds ordered this encounter  Medications  . benztropine (COGENTIN) 0.5 MG tablet    Sig: Take 1 tablet (0.5 mg total) by mouth 2 (two) times daily.    Dispense:  60 tablet    Refill:  2  . risperidone (RISPERDAL) 4 MG tablet    Sig: Take 1 tablet (4 mg total) by mouth 2 (two) times daily.    Dispense:  60 tablet    Refill:  2  . chlorproMAZINE (THORAZINE) 200 MG tablet    Sig: Takes one tablet in the am, one at noon and 4 at bedtime    Dispense:  180 tablet    Refill:  2  . LORazepam (ATIVAN) 2 MG tablet    Sig: Take one half tablet twice a day and one tablet at bedtime    Dispense:  60 tablet    Refill:  2    Medical Decision Making Problem Points:  Established problem, stable/improving (1), Review of last therapy session (1) and Review of psycho-social stressors (1) Data Points:  Review or order clinical lab tests (1) Review of medication regiment & side effects (2)  ROSS, DEBORAH, MD

## 2015-03-10 DIAGNOSIS — J069 Acute upper respiratory infection, unspecified: Secondary | ICD-10-CM | POA: Diagnosis not present

## 2015-03-10 DIAGNOSIS — K5900 Constipation, unspecified: Secondary | ICD-10-CM | POA: Diagnosis not present

## 2015-03-10 DIAGNOSIS — E669 Obesity, unspecified: Secondary | ICD-10-CM | POA: Diagnosis not present

## 2015-03-10 DIAGNOSIS — D649 Anemia, unspecified: Secondary | ICD-10-CM | POA: Diagnosis not present

## 2015-03-10 DIAGNOSIS — K219 Gastro-esophageal reflux disease without esophagitis: Secondary | ICD-10-CM | POA: Diagnosis not present

## 2015-03-10 DIAGNOSIS — F17200 Nicotine dependence, unspecified, uncomplicated: Secondary | ICD-10-CM | POA: Diagnosis not present

## 2015-03-10 DIAGNOSIS — F209 Schizophrenia, unspecified: Secondary | ICD-10-CM | POA: Diagnosis not present

## 2015-03-10 DIAGNOSIS — R739 Hyperglycemia, unspecified: Secondary | ICD-10-CM | POA: Diagnosis not present

## 2015-03-10 DIAGNOSIS — J449 Chronic obstructive pulmonary disease, unspecified: Secondary | ICD-10-CM | POA: Diagnosis not present

## 2015-03-10 IMAGING — CR DG CHEST 2V
2 series · 2 of 2 positions shown · non-contrast
Comparison: 08/24/2009 report

CLINICAL DATA: Weakness.  Tobacco use.

EXAM:
CHEST  2 VIEW

[view not recorded (1 of 2)]
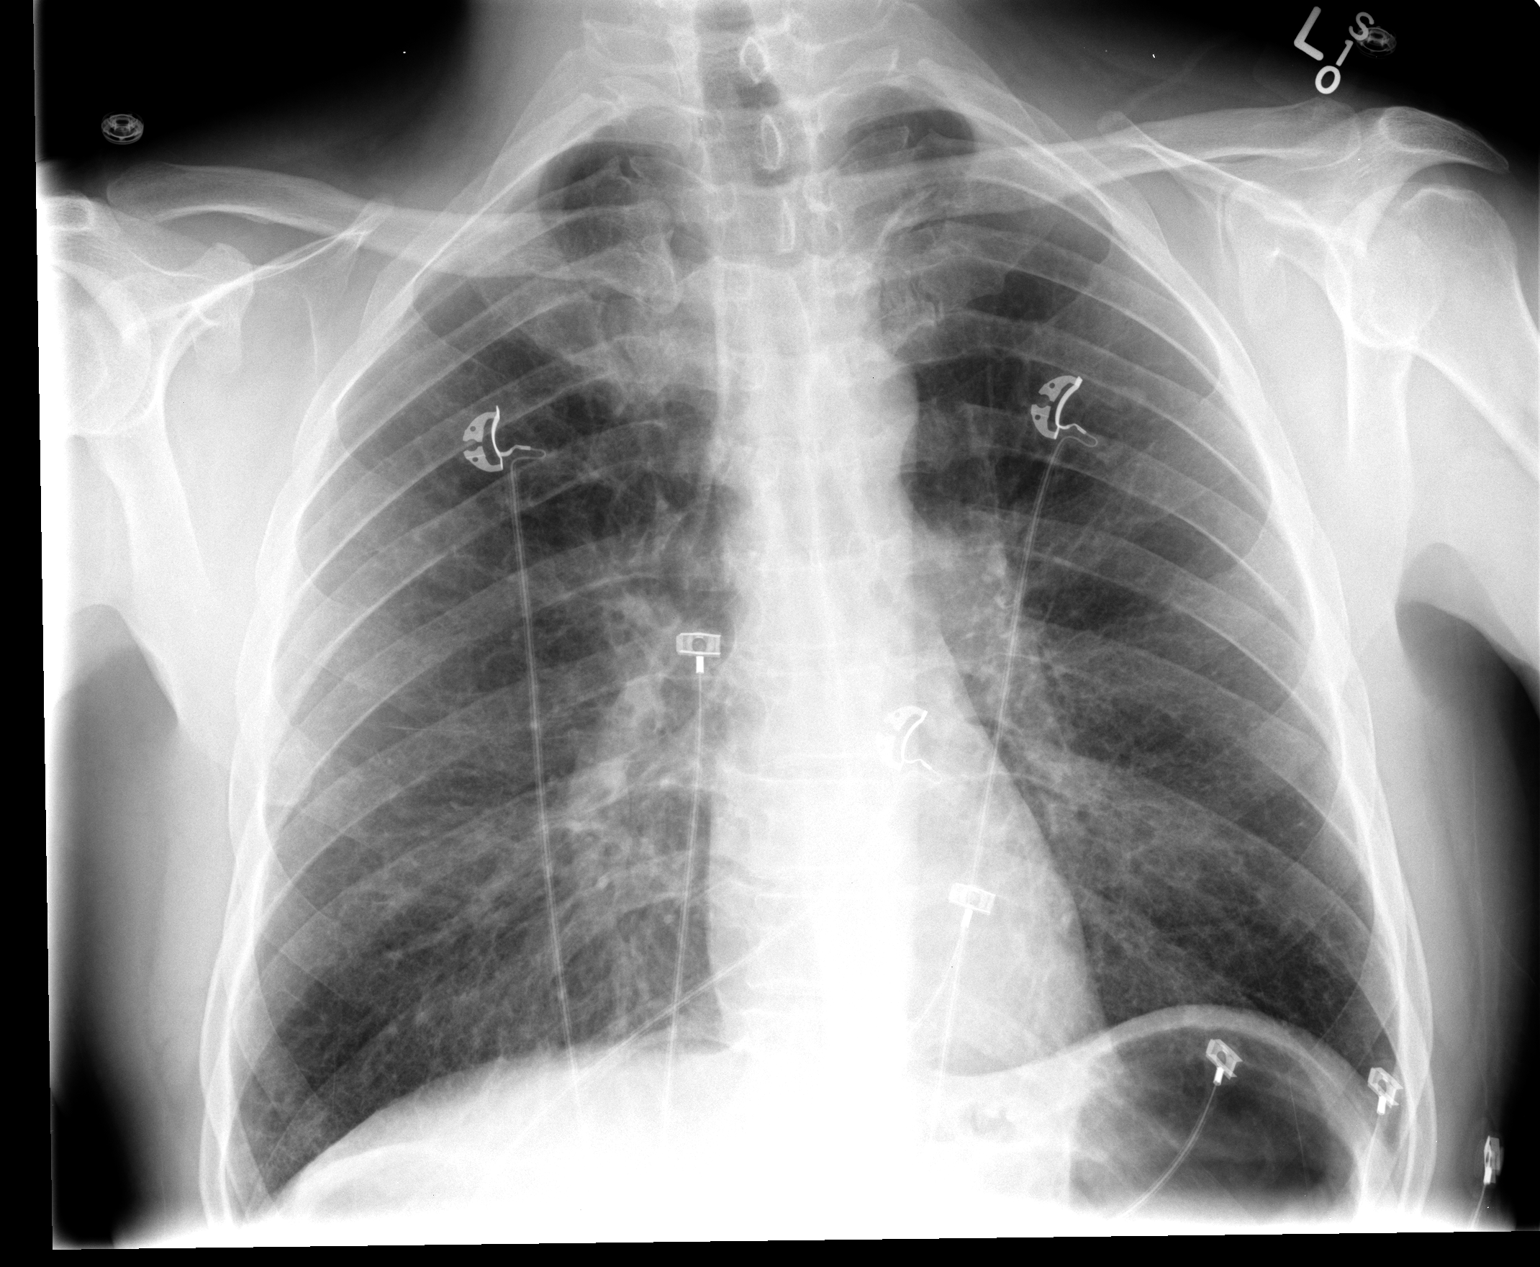

[view not recorded (2 of 2)]
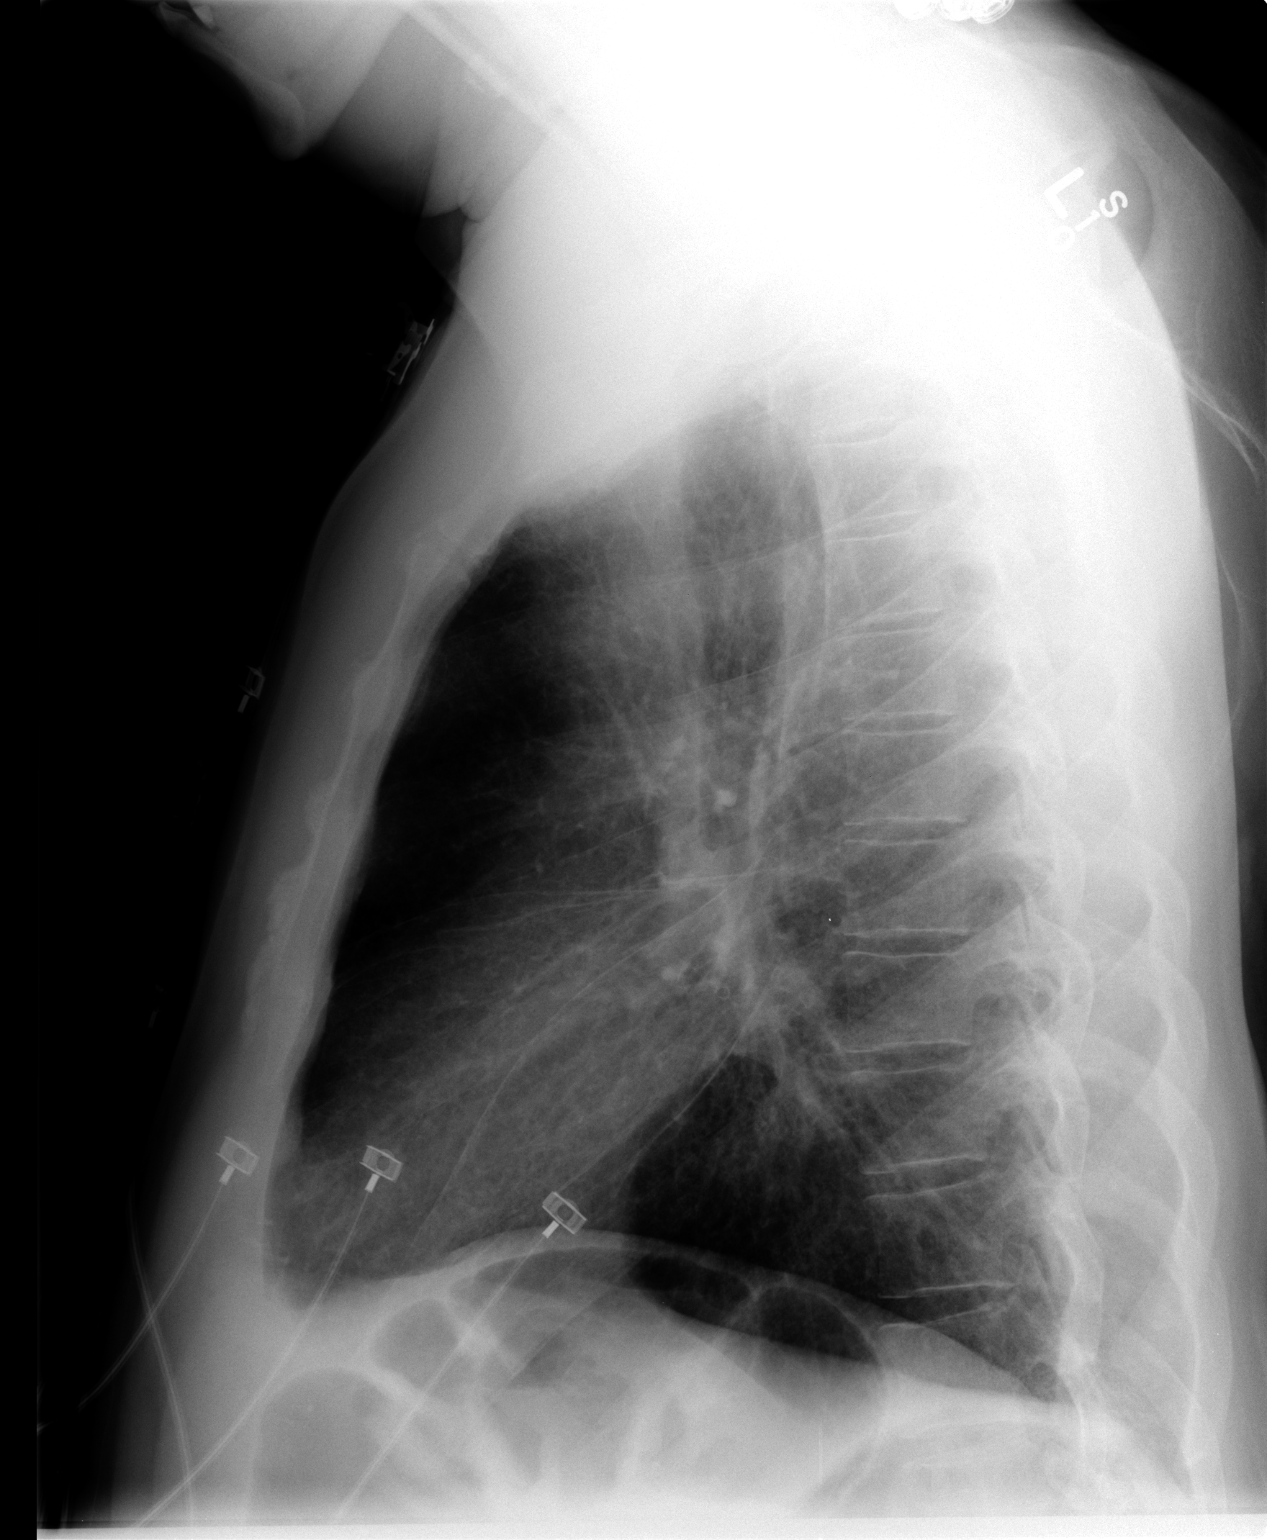

[2 of 2 positions shown; findings below may reference images not displayed]

FINDINGS: Airway thickening is present, suggesting bronchitis or reactive
airways disease. Tapering of the peripheral pulmonary vasculature
favors emphysema. Cardiac and mediastinal margins appear normal. No
pleural effusion.
IMPRESSION: 1. Airway thickening is present, suggesting bronchitis or reactive
airways disease.
2. Emphysema.

## 2015-05-30 ENCOUNTER — Encounter (HOSPITAL_COMMUNITY): Payer: Self-pay | Admitting: Psychiatry

## 2015-05-30 ENCOUNTER — Ambulatory Visit (INDEPENDENT_AMBULATORY_CARE_PROVIDER_SITE_OTHER): Payer: Medicare Other | Admitting: Psychiatry

## 2015-05-30 VITALS — BP 114/78 | HR 86 | Ht 67.0 in | Wt 155.0 lb

## 2015-05-30 DIAGNOSIS — F200 Paranoid schizophrenia: Secondary | ICD-10-CM

## 2015-05-30 DIAGNOSIS — F201 Disorganized schizophrenia: Secondary | ICD-10-CM

## 2015-05-30 MED ORDER — BENZTROPINE MESYLATE 0.5 MG PO TABS: 0.5000 mg | ORAL_TABLET | Freq: Two times a day (BID) | ORAL | Status: DC

## 2015-05-30 MED ORDER — CHLORPROMAZINE HCL 200 MG PO TABS: ORAL_TABLET | ORAL | Status: DC

## 2015-05-30 MED ORDER — LORAZEPAM 2 MG PO TABS: ORAL_TABLET | ORAL | Status: DC

## 2015-05-30 MED ORDER — RISPERIDONE 4 MG PO TABS: 4.0000 mg | ORAL_TABLET | Freq: Two times a day (BID) | ORAL | Status: DC

## 2015-05-30 NOTE — Progress Notes (Signed)
Patient ID: ORMOND LAZO, male   DOB: 14-Mar-1955, 60 y.o.   MRN: [id number] Patient ID: BYARD CARRANZA, male   DOB: 1955-03-02, 60 y.o.   MRN: [id number] Patient ID: ALEE KATEN, male   DOB: [birth year], 60 y.o.   MRN: [id number] Patient ID: FITZ MATSUO, male   DOB: Jan 01, 1956, 60 y.o.   MRN: [id number] Patient ID: DOUGLASS DUNSHEE, male   DOB: 06/23/1955, 60 y.o.   MRN: [id number] Patient ID: ASHDON GILLSON, male   DOB: March 16, 1955, 60 y.o.   MRN: [id number] Patient ID: JONTAE ADEBAYO, male   DOB: 05/30/1955, 60 y.o.   MRN: [id number] Patient ID: HOBIE KOHLES, male   DOB: November 04, 1955, 60 y.o.   MRN: [id number] Patient ID: TRADARIUS REINWALD, male   DOB: [birth year], 60 y.o.   MRN: [id number] Patient ID: CHAYCE ROBBINS, male   DOB: 1955/03/05, 60 y.o.   MRN: [id number] [facility] [address] Progress Note GREGORI ABRIL MRN: [id number] DOB: March 27, 1955 Age: 60 y.o.  Date: 05/30/2015   Chief Complaint  Patient presents with  . Schizophrenia  . Follow-up   History of presenting illness Patient is a 60 year old [place] American male who came for his followup appointment with the staff of [facility]. He has been living in a rest home for about 30 years.  Returns after 3 months with his caregiver from the rest home. He is calm today. He is somewhat difficult to understand because all his teeth have been pulled. However he answers questions appropriately.His caregiver states that at times he gets obsessed about money and wonders how to pay his bills and asked the same questions repeatedly. However he has not been agitated or violent. His appetite is up and down and he has lost 5 pounds recently. His basic metabolic, lipid panel are good. His A1c is elevated on little bit to 5.9. He goes to periods of smoking a lot but tells me today he wants to get down to just 3 cigarettes a day and I commended him for this  Past psychiatric history Patient has significant history of schizophrenia disorganized type.   He [facility].  He do not recall the details of these admission however since 1999 he has been not admitted to psychiatric hospital.  He seen in this office since 1999 and has been managed on 2 antipsychotic medication.  Patient do not recall if he ever had any suicidal attempt.  Medical history Hyperlipidemia and GERD vitamin D deficiency IBS  Family History family history includes ADD / ADHD in his other; Alcohol abuse in his brother; Anxiety disorder in his brother and other; Depression in his brother and other; Drug abuse in his brother and other; Mental illness in his other. There is no history of Bipolar disorder, Dementia, OCD, Paranoid behavior, Schizophrenia, Seizures, Sexual abuse, Physical abuse, or Colon cancer.  Mental status examination Patient is fairly groomed and dressed.t He maintained limited eye contact.  His speech is poorly articulated but he was more coherent and he has been in previous visits. He is fairly cooperative and did not show any signs of agitation or anger but his attention and concentration are poor. He was  able to answer questions more appropriately today . He is  talking to himself  He denies any active or passive suicidal thinking. He is cooperative. He has no extrapyramidal side effects noted. He is alert . His insight judgment is poor and impulse  control is fair. His speech is garbled fund of knowledge is poor and memory and recall are also poor Lab Results:  No results found for this or any previous visit (from the past 2016 hour(s)). PCP draws routine labs and nothing is emerging as of concern.  Assessment. Axis I Schizophrenia chronic disorganized type Axis II deferred Axis III hyperlipidemia and GERD Axis IV moderate Axis V 45  Plan/Discussion: He will continue his current dosages of Risperda and Thorazine for schizophrenia and Cogentin for side effects from and psychotic His Ativan will be  continued at 1 mg twice a day and 2 mg at bedtime for anxiety.  Followup in 3 months. Staff can call us at anytime if his agitation worsens. He'll return in 3 months  MEDICATIONS this encounter: Meds ordered this encounter  Medications  . risperidone (RISPERDAL) 4 MG tablet    Sig: Take 1 tablet (4 mg total) by mouth 2 (two) times daily.    Dispense:  60 tablet    Refill:  2  . benztropine (COGENTIN) 0.5 MG tablet    Sig: Take 1 tablet (0.5 mg total) by mouth 2 (two) times daily.    Dispense:  60 tablet    Refill:  2  . chlorproMAZINE (THORAZINE) 200 MG tablet    Sig: Takes one tablet in the am, one at noon and 4 at bedtime    Dispense:  180 tablet    Refill:  2  . LORazepam (ATIVAN) 2 MG tablet    Sig: Take one half tablet twice a day and one tablet at bedtime    Dispense:  60 tablet    Refill:  2    Medical Decision Making Problem Points:  Established problem, stable/improving (1), Review of last therapy session (1) and Review of psycho-social stressors (1) Data Points:  Review or order clinical lab tests (1) Review of medication regiment & side effects (2)  Karalee Hauter, MD

## 2015-06-01 DIAGNOSIS — L259 Unspecified contact dermatitis, unspecified cause: Secondary | ICD-10-CM | POA: Diagnosis not present

## 2015-06-01 DIAGNOSIS — F209 Schizophrenia, unspecified: Secondary | ICD-10-CM | POA: Diagnosis not present

## 2015-06-01 DIAGNOSIS — K219 Gastro-esophageal reflux disease without esophagitis: Secondary | ICD-10-CM | POA: Diagnosis not present

## 2015-07-04 ENCOUNTER — Encounter: Payer: Self-pay | Admitting: Internal Medicine

## 2015-08-01 ENCOUNTER — Other Ambulatory Visit: Payer: Self-pay

## 2015-08-01 ENCOUNTER — Encounter: Payer: Self-pay | Admitting: Gastroenterology

## 2015-08-01 ENCOUNTER — Ambulatory Visit (INDEPENDENT_AMBULATORY_CARE_PROVIDER_SITE_OTHER): Payer: Medicare Other | Admitting: Gastroenterology

## 2015-08-01 VITALS — BP 112/70 | HR 99 | Temp 98.2°F | Ht 68.0 in | Wt 154.4 lb

## 2015-08-01 DIAGNOSIS — Z1211 Encounter for screening for malignant neoplasm of colon: Secondary | ICD-10-CM | POA: Diagnosis not present

## 2015-08-01 DIAGNOSIS — K5900 Constipation, unspecified: Secondary | ICD-10-CM | POA: Diagnosis not present

## 2015-08-01 DIAGNOSIS — Z86010 Personal history of colonic polyps: Secondary | ICD-10-CM

## 2015-08-01 MED ORDER — PEG 3350-KCL-NA BICARB-NACL 420 G PO SOLR: 4000.0000 mL | Freq: Once | ORAL | Status: DC

## 2015-08-01 MED ORDER — BISACODYL 5 MG PO TBEC: 5.0000 mg | DELAYED_RELEASE_TABLET | Freq: Every day | ORAL | Status: AC | PRN

## 2015-08-01 MED ORDER — LINACLOTIDE 290 MCG PO CAPS: 290.0000 ug | ORAL_CAPSULE | Freq: Every day | ORAL | Status: DC

## 2015-08-01 MED ORDER — PHOSPHATE LAXATIVE 2.7-7.2 GM/15ML PO SOLN
15.0000 mL | Freq: Once | ORAL | Status: DC
Start: 2015-08-01 — End: 2015-11-21

## 2015-08-01 NOTE — Patient Instructions (Signed)
1. Stop Colace. 2. Increase Linzess to 290mcg every morning before breakfast. [facility]  3. [facility] See separate instructions.
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
## 2015-08-03 ENCOUNTER — Encounter: Payer: Self-pay | Admitting: Gastroenterology

## 2015-08-03 NOTE — Progress Notes (Signed)
CC'ED TO PCP 

## 2015-08-03 NOTE — Assessment & Plan Note (Signed)
60 year old male who presents for one year early interval follow-up screening colonoscopy. Inadequate bowel prep last year precluded exam. He had a hyperplastic rectal polyp removed. Patient has been having some issues with constipation. Increased Linzess to 290mcg daily and held colace. Caregiver to notify us if he's having to frequent stools or diarrhea. Colonoscopy with deep sedation in the OR due to polypharmacy in the near future.  I have discussed the risks, alternatives, benefits with regards to but not limited to the risk of reaction to medication, bleeding, infection, perforation and the patient is agreeable to proceed. Written consent to be obtained.

## 2015-08-08 ENCOUNTER — Telehealth: Payer: Self-pay | Admitting: Internal Medicine

## 2015-08-08 NOTE — Telephone Encounter (Signed)
OK. Let's go back to the linzess daily every am on empty stomach. And add Miralax one capful daily at bedtime.

## 2015-08-08 NOTE — Telephone Encounter (Signed)
Called and spoke with Tyler Aasoris at [facility] and she is aware of recommendations.

## 2015-08-08 NOTE — Telephone Encounter (Signed)
Patient caretaker, sharon, called and stated that the patient is going to the bathroom without warning once he began the medication he was given here last week.  linzess 145 -290   Please advise [phone]

## 2015-08-08 NOTE — Telephone Encounter (Signed)
Spoke with Theodore [facility] at [facility], pt is has stopped the linzess 145mcg and the colace and has been taking the linzess 290mcg since the 15th and he is having 3-4 very loose stools during the day. He is not making it to the bathroom and they are having to clean him up each time.

## 2015-08-10 MED ORDER — POLYETHYLENE GLYCOL 3350 17 GM/SCOOP PO POWD: ORAL | Status: AC

## 2015-08-10 MED ORDER — LINACLOTIDE 145 MCG PO CAPS: 145.0000 ug | ORAL_CAPSULE | Freq: Every day | ORAL | Status: AC

## 2015-08-10 NOTE — Telephone Encounter (Signed)
Pt family called and states that he needs a new Rx sent to [facility] for Linzess 145

## 2015-08-10 NOTE — Addendum Note (Signed)
Addended by: Tiffany KocherLEWIS, Christyanna Mckeon S on: 08/10/2015 09:22 AM   Modules accepted: Orders, Medications

## 2015-08-10 NOTE — Telephone Encounter (Signed)
Done. rx for miralax sent as well as the linzess.

## 2015-08-15 NOTE — Patient Instructions (Signed)
Theodore BastosHenry L Smith  08/15/2015     @PREFPERIOPPHARMACY @   Your procedure is scheduled on  08/21/2015   Report to Theodore Smith at  1030  A.M.  Call this number if you have problems the morning of surgery:  [phone]   Remember:  Do not eat food or drink liquids after midnight.  Take these medicines the morning of surgery with A SIP OF WATER  Cogentin, thorazine, claritin, ativan, prilosec, risperdal.   Do not wear jewelry, make-up or nail polish.  Do not wear lotions, powders, or perfumes.  You may wear deoderant.  Do not shave 48 hours prior to surgery.  Men may shave face and neck.  Do not bring valuables to the hospital.  [facility] any belongings or valuables.  Contacts, dentures or bridgework may not be worn into surgery.  Leave your suitcase in the car.  After surgery it may be brought to your room.  For patients admitted to the hospital, discharge time will be determined by your treatment team.  Patients discharged the day of surgery will not be allowed to drive home.   Name and phone number of your driver:   family Special instructions:  Follow the diet and prep instructions given to you by Dr Luvenia Starchourk's office.  Please read over the following fact sheets that you were given. Pain Booklet, Surgical Site Infection Prevention, Anesthesia Post-op Instructions and Care and Recovery After Surgery      Colonoscopy A colonoscopy is an exam to look at the entire large intestine (colon). This exam can help find problems such as tumors, polyps, inflammation, and areas of bleeding. The exam takes about 1 hour.  LET [facility] KNOW ABOUT:   Any allergies you have.  All medicines you are taking, including vitamins, herbs, eye drops, creams, and over-the-counter medicines.  Previous problems you or members of your family have had with the use of anesthetics.  Any blood disorders you have.  Previous surgeries you have  had.  Medical conditions you have. RISKS AND COMPLICATIONS  Generally, this is a safe procedure. However, as with any procedure, complications can occur. Possible complications include:  Bleeding.  Tearing or rupture of the colon wall.  Reaction to medicines given during the exam.  Infection (rare). BEFORE THE PROCEDURE   Ask your health care provider about changing or stopping your regular medicines.  You may be prescribed an oral bowel prep. This involves drinking a large amount of medicated liquid, starting the day before your procedure. The liquid will cause you to have multiple loose stools until your stool is almost clear or light green. This cleans out your colon in preparation for the procedure.  Do not eat or drink anything else once you have started the bowel prep, unless your health care provider tells you it is safe to do so.  Arrange for someone to drive you home after the procedure. PROCEDURE   You will be given medicine to help you relax (sedative).  You will lie on your side with your knees bent.  A long, flexible tube with a light and camera on the end (colonoscope) will be inserted through the rectum and into the colon. The camera sends video back to a computer screen as it moves through the colon. The colonoscope also releases carbon dioxide gas to inflate the colon. This helps your health care provider see the area better.  During the exam,  your health care provider may take a small tissue sample (biopsy) to be examined under a microscope if any abnormalities are found.  The exam is finished when the entire colon has been viewed. AFTER THE PROCEDURE   Do not drive for 24 hours after the exam.  You may have a small amount of blood in your stool.  You may pass moderate amounts of gas and have mild abdominal cramping or bloating. This is caused by the gas used to inflate your colon during the exam.  Ask when your test results will be ready and how you will  get your results. Make sure you get your test results.   This information is not intended to replace advice given to you by your health care provider. Make sure you discuss any questions you have with your health care provider.   Document Released: 02/02/2000 Document Revised: 11/25/2012 Document Reviewed: 10/12/2012 Elsevier Interactive Patient Education 2016 [employer] Colonoscopy, Care After Refer to this sheet in the next few weeks. These instructions provide you with information on caring for yourself after your procedure. Your health care provider may also give you more specific instructions. Your treatment has been planned according to current medical practices, but problems sometimes occur. Call your health care provider if you have any problems or questions after your procedure. WHAT TO EXPECT AFTER THE PROCEDURE  After your procedure, it is typical to have the following:  A small amount of blood in your stool.  Moderate amounts of gas and mild abdominal cramping or bloating. HOME CARE INSTRUCTIONS  Do not drive, operate machinery, or sign important documents for 24 hours.  You may shower and resume your regular physical activities, but move at a slower pace for the first 24 hours.  Take frequent rest periods for the first 24 hours.  Walk around or put a warm pack on your abdomen to help reduce abdominal cramping and bloating.  Drink enough fluids to keep your urine clear or pale yellow.  You may resume your normal diet as instructed by your health care provider. Avoid heavy or fried foods that are hard to digest.  Avoid drinking alcohol for 24 hours or as instructed by your health care provider.  Only take over-the-counter or prescription medicines as directed by your health care provider.  If a tissue sample (biopsy) was taken during your procedure:  Do not take aspirin or blood thinners for 7 days, or as instructed by your health care provider.  Do not drink  alcohol for 7 days, or as instructed by your health care provider.  Eat soft foods for the first 24 hours. SEEK MEDICAL CARE IF: You have persistent spotting of blood in your stool 2-3 days after the procedure. SEEK IMMEDIATE MEDICAL CARE IF:  You have more than a small spotting of blood in your stool.  You pass large blood clots in your stool.  Your abdomen is swollen (distended).  You have nausea or vomiting.  You have a fever.  You have increasing abdominal pain that is not relieved with medicine.   This information is not intended to replace advice given to you by your health care provider. Make sure you discuss any questions you have with your health care provider.   Document Released: 09/19/2003 Document Revised: 11/25/2012 Document Reviewed: 10/12/2012 Elsevier Interactive Patient Education 2016 Elsevier Inc[employer] PATIENT INSTRUCTIONS POST-ANESTHESIA  IMMEDIATELY FOLLOWING SURGERY:  Do not drive or operate machinery for the first twenty four hours after surgery.  Do not make any  important decisions for twenty four hours after surgery or while taking narcotic pain medications or sedatives.  If you develop intractable nausea and vomiting or a severe headache please notify your doctor immediately.  FOLLOW-UP:  Please make an appointment with your surgeon as instructed. You do not need to follow up with anesthesia unless specifically instructed to do so.  WOUND CARE INSTRUCTIONS (if applicable):  Keep a dry clean dressing on the anesthesia/puncture wound site if there is drainage.  Once the wound has quit draining you may leave it open to air.  Generally you should leave the bandage intact for twenty four hours unless there is drainage.  If the epidural site drains for more than 36-48 hours please call the anesthesia department.  QUESTIONS?:  Please feel free to call your physician or the hospital operator if you have any questions, and they will be happy to assist you.

## 2015-08-17 ENCOUNTER — Encounter (HOSPITAL_COMMUNITY): Payer: Self-pay

## 2015-08-17 ENCOUNTER — Other Ambulatory Visit: Payer: Self-pay

## 2015-08-17 ENCOUNTER — Encounter (HOSPITAL_COMMUNITY)
Admission: RE | Admit: 2015-08-17 | Discharge: 2015-08-17 | Disposition: A | Discharge status: Home / Self Care | Payer: Medicare Other | Source: Ambulatory Visit | Attending: Internal Medicine | Admitting: Internal Medicine

## 2015-08-17 DIAGNOSIS — Z01812 Encounter for preprocedural laboratory examination: Secondary | ICD-10-CM | POA: Insufficient documentation

## 2015-08-17 DIAGNOSIS — Z01810 Encounter for preprocedural cardiovascular examination: Secondary | ICD-10-CM | POA: Diagnosis present

## 2015-08-17 LAB — CBC WITH DIFFERENTIAL/PLATELET
Basophils Absolute: 0 10*3/uL (ref 0.0–0.1)
Basophils Relative: 1 %
EOS ABS: 0.1 10*3/uL (ref 0.0–0.7)
Eosinophils Relative: 2 %
HEMATOCRIT: 45.5 % (ref 39.0–52.0)
HEMOGLOBIN: 15.6 g/dL (ref 13.0–17.0)
LYMPHS ABS: 1.8 10*3/uL (ref 0.7–4.0)
Lymphocytes Relative: 27 %
MCH: 30.6 pg (ref 26.0–34.0)
MCHC: 34.3 g/dL (ref 30.0–36.0)
MCV: 89.4 fL (ref 78.0–100.0)
MONOS PCT: 10 %
Monocytes Absolute: 0.7 10*3/uL (ref 0.1–1.0)
NEUTROS PCT: 60 %
Neutro Abs: 4.1 10*3/uL (ref 1.7–7.7)
Platelets: 215 10*3/uL (ref 150–400)
RBC: 5.09 MIL/uL (ref 4.22–5.81)
RDW: 14.7 % (ref 11.5–15.5)
WBC: 6.8 10*3/uL (ref 4.0–10.5)

## 2015-08-17 LAB — BASIC METABOLIC PANEL
Anion gap: 8 (ref 5–15)
BUN: 10 mg/dL (ref 6–20)
CHLORIDE: 108 mmol/L (ref 101–111)
CO2: 23 mmol/L (ref 22–32)
CREATININE: 1.06 mg/dL (ref 0.61–1.24)
Calcium: 9.6 mg/dL (ref 8.9–10.3)
GFR calc Af Amer: >60 mL/min (ref >60)
GFR calc non Af Amer: >60 mL/min (ref >60)
GLUCOSE: 83 mg/dL (ref 65–99)
Potassium: 4.1 mmol/L (ref 3.5–5.1)
Sodium: 139 mmol/L (ref 135–145)

## 2015-08-21 ENCOUNTER — Ambulatory Visit (HOSPITAL_COMMUNITY): Payer: Medicare Other | Admitting: Anesthesiology

## 2015-08-21 ENCOUNTER — Encounter (HOSPITAL_COMMUNITY)
Admission: RE | Disposition: A | Discharge status: Home / Self Care | Payer: Self-pay | Source: Ambulatory Visit | Attending: Internal Medicine

## 2015-08-21 ENCOUNTER — Encounter (HOSPITAL_COMMUNITY): Payer: Self-pay | Admitting: *Deleted

## 2015-08-21 ENCOUNTER — Ambulatory Visit (HOSPITAL_COMMUNITY)
Admission: RE | Admit: 2015-08-21 | Discharge: 2015-08-21 | Disposition: A | Discharge status: Home / Self Care | Payer: Medicare Other | Source: Ambulatory Visit | Attending: Internal Medicine | Admitting: Internal Medicine

## 2015-08-21 DIAGNOSIS — Z538 Procedure and treatment not carried out for other reasons: Secondary | ICD-10-CM | POA: Insufficient documentation

## 2015-08-21 DIAGNOSIS — K219 Gastro-esophageal reflux disease without esophagitis: Secondary | ICD-10-CM | POA: Diagnosis not present

## 2015-08-21 DIAGNOSIS — Z1211 Encounter for screening for malignant neoplasm of colon: Secondary | ICD-10-CM | POA: Diagnosis not present

## 2015-08-21 DIAGNOSIS — F209 Schizophrenia, unspecified: Secondary | ICD-10-CM | POA: Diagnosis not present

## 2015-08-21 DIAGNOSIS — Z86010 Personal history of colonic polyps: Secondary | ICD-10-CM | POA: Diagnosis not present

## 2015-08-21 DIAGNOSIS — F17210 Nicotine dependence, cigarettes, uncomplicated: Secondary | ICD-10-CM | POA: Diagnosis not present

## 2015-08-21 DIAGNOSIS — E785 Hyperlipidemia, unspecified: Secondary | ICD-10-CM | POA: Insufficient documentation

## 2015-08-21 DIAGNOSIS — K5900 Constipation, unspecified: Secondary | ICD-10-CM | POA: Diagnosis not present

## 2015-08-21 DIAGNOSIS — J449 Chronic obstructive pulmonary disease, unspecified: Secondary | ICD-10-CM | POA: Diagnosis not present

## 2015-08-21 HISTORY — PX: COLONOSCOPY WITH PROPOFOL: SHX5780

## 2015-08-21 SURGERY — COLONOSCOPY WITH PROPOFOL
Anesthesia: Monitor Anesthesia Care

## 2015-08-21 MED ORDER — FENTANYL CITRATE (PF) 100 MCG/2ML IJ SOLN
25.0000 ug | INTRAMUSCULAR | Status: DC | PRN
  Administered 2015-08-21: 25 ug via INTRAVENOUS

## 2015-08-21 MED ORDER — ONDANSETRON HCL 4 MG/2ML IJ SOLN: 4.0000 mg | Freq: Once | INTRAMUSCULAR | Status: DC | PRN

## 2015-08-21 MED ORDER — MIDAZOLAM HCL 5 MG/5ML IJ SOLN
INTRAMUSCULAR | Status: DC | PRN
  Administered 2015-08-21: 2 mg via INTRAVENOUS

## 2015-08-21 MED ORDER — MIDAZOLAM HCL 2 MG/2ML IJ SOLN
INTRAMUSCULAR | Status: AC
  Filled 2015-08-21: qty 2

## 2015-08-21 MED ORDER — PROPOFOL 10 MG/ML IV BOLUS
INTRAVENOUS | Status: AC
  Filled 2015-08-21: qty 20

## 2015-08-21 MED ORDER — PROPOFOL 500 MG/50ML IV EMUL
INTRAVENOUS | Status: DC | PRN
  Administered 2015-08-21: 50 ug/kg/min via INTRAVENOUS

## 2015-08-21 MED ORDER — LACTATED RINGERS IV SOLN
INTRAVENOUS | Status: DC
  Administered 2015-08-21: 1000 mL via INTRAVENOUS

## 2015-08-21 MED ORDER — LIDOCAINE HCL (CARDIAC) 10 MG/ML IV SOLN
INTRAVENOUS | Status: DC | PRN
  Administered 2015-08-21: 25 mg via INTRAVENOUS

## 2015-08-21 MED ORDER — MIDAZOLAM HCL 2 MG/2ML IJ SOLN
1.0000 mg | INTRAMUSCULAR | Status: DC | PRN
  Administered 2015-08-21: 1 mg via INTRAVENOUS

## 2015-08-21 MED ORDER — FENTANYL CITRATE (PF) 100 MCG/2ML IJ SOLN: 25.0000 ug | INTRAMUSCULAR | Status: DC | PRN

## 2015-08-21 MED ORDER — LIDOCAINE HCL (PF) 1 % IJ SOLN
INTRAMUSCULAR | Status: AC
  Filled 2015-08-21: qty 5

## 2015-08-21 MED ORDER — FENTANYL CITRATE (PF) 100 MCG/2ML IJ SOLN
INTRAMUSCULAR | Status: AC
  Filled 2015-08-21: qty 2

## 2015-08-21 NOTE — Anesthesia Preprocedure Evaluation (Signed)
Anesthesia Evaluation  Patient identified by MRN, date of birth, ID band Patient awake    Reviewed: Allergy & Precautions, NPO status , Patient's Chart, lab work & pertinent test results  Airway Mallampati: II  TM Distance: >3 FB Neck ROM: Full    Dental  (+) Edentulous Upper, Edentulous Lower   Pulmonary COPD, Current Smoker,    breath sounds clear to auscultation       Cardiovascular negative cardio ROS   Rhythm:Regular Rate:Normal     Neuro/Psych PSYCHIATRIC DISORDERS Schizophrenia    GI/Hepatic GERD  Medicated,  Endo/Other    Renal/GU      Musculoskeletal   Abdominal   Peds  Hematology   Anesthesia Other Findings   Reproductive/Obstetrics                             Anesthesia Physical Anesthesia Plan  ASA: III  Anesthesia Plan: MAC   Post-op Pain Management:    Induction: Intravenous  Airway Management Planned: Simple Face Mask  Additional Equipment:   Intra-op Plan:   Post-operative Plan:   Informed Consent: I have reviewed the patients History and Physical, chart, labs and discussed the procedure including the risks, benefits and alternatives for the proposed anesthesia with the patient or authorized representative who has indicated his/her understanding and acceptance.     Plan Discussed with:   Anesthesia Plan Comments:         Anesthesia Quick Evaluation

## 2015-08-21 NOTE — Anesthesia Procedure Notes (Signed)
Procedure Name: MAC Date/Time: 08/21/2015 10:54 AM Performed by: Pernell DupreADAMS, Keenon Leitzel A Pre-anesthesia Checklist: Patient identified, Emergency Drugs available, Suction available, Patient being monitored and Timeout performed Patient Re-evaluated:Patient Re-evaluated prior to inductionOxygen Delivery Method: Simple face mask

## 2015-08-21 NOTE — Progress Notes (Signed)
Quick Note:  Stacey, please forward to PCP in Susan's absence. ______ 

## 2015-08-21 NOTE — Op Note (Signed)
[facility] Patient Name: Theodore Smith Procedure Date: 08/21/2015 10:56 AM MRN: [id number] Date of Birth: [birth year] Attending MD: Gennette Pacobert Michael Ivor Kishi , MD CSN: [id number] Age: [age] Admit Type: Outpatient Procedure:                Colonoscopy - attempted Indications:              Screening for colorectal malignant neoplasm Providers:                Gennette Pacobert Michael Emersen Carroll, MD, Loma MessingLurae B. Patsy LagerAlbert RN, RN,                            Burke Keelsrisann Tilley, Technician Referring MD:              Medicines:                Propofol per Anesthesia Complications:            No immediate complications. Estimated Blood Loss:     Estimated blood loss: none. Procedure:                Pre-Anesthesia Assessment:                           - Prior to the procedure, a History and Physical                            was performed, and patient medications and                            allergies were reviewed. The patient's tolerance of                            previous anesthesia was also reviewed. The risks                            and benefits of the procedure and the sedation                            options and risks were discussed with the patient.                            All questions were answered, and informed consent                            was obtained. ASA Grade Assessment: II - A patient                            with mild systemic disease. After reviewing the                            risks and benefits, the patient was deemed in                            satisfactory condition to undergo the procedure.  After obtaining informed consent, the colonoscope                            was passed under direct vision. Throughout the                            procedure, the patient's blood pressure, pulse, and                            oxygen saturations were monitored continuously. The                            Colonoscope was introduced through the anus and                       advanced to the the rectum. The colonoscopy was                            aborted due to inadequate bowel prep. The quality                            of the bowel preparation was inadequate. No                            anatomical landmarks were photographed. Scope In: 11:00:47 AM Scope Out: 11:01:29 AM Total Procedure Duration: 0 hours 0 minutes 42 seconds  Findings:      much viscous liquid and formed stool in the rectum precluded completion       examination today. Impression:               - The procedure was aborted due to inadequate bowel                            prep.                           - No specimens collected. Moderate Sedation:      Moderate (conscious) sedation was personally administered by an       anesthesia professional. The following parameters were monitored: oxygen       saturation, heart rate, blood pressure, respiratory rate, EKG, adequacy       of pulmonary ventilation, and response to care. Total physician       intraservice time was 5 minutes. Recommendation:           - Patient has a contact number available for                            emergencies. The signs and symptoms of potential                            delayed complications were discussed with the                            patient. Return to normal activities tomorrow.  Written discharge instructions were provided to the                            patient.                           - Advance diet as tolerated.                           - Continue present medications.                           - Repeat colonoscopy in 3 months for screening                            purposes.                           - Return to GI clinic in 3 months. Procedure Code(s):        --- Professional ---                           [phone], 53, Colonoscopy, flexible; diagnostic,                            including collection of specimen(s) by brushing or                             washing, when performed (separate procedure) Diagnosis Code(s):        --- Professional ---                           Z12.11, Encounter for screening for malignant                            neoplasm of colon                           Z53.8, Procedure and treatment not carried out for                            other reasons CPT copyright 2016 American Medical Association. All rights reserved. The codes documented in this report are preliminary and upon coder review may  be revised to meet current compliance requirements. Gerrit Friends. Aryaman Haliburton, MD Gennette Pac, MD 08/21/2015 11:13:15 AM This report has been signed electronically. Number of Addenda: 0

## 2015-08-21 NOTE — Anesthesia Postprocedure Evaluation (Signed)
Anesthesia Post Note  Patient: Tobi BastosHenry L Miera  Procedure(s) Performed: Procedure(s) (LRB): COLONOSCOPY WITH PROPOFOL (N/A)  Patient location during evaluation: PACU Anesthesia Type: MAC Level of consciousness: awake and oriented Pain management: pain level controlled Vital Signs Assessment: post-procedure vital signs reviewed and stable Respiratory status: spontaneous breathing and patient connected to face mask oxygen Cardiovascular status: stable Postop Assessment: no signs of nausea or vomiting Anesthetic complications: no    Last Vitals:  Filed Vitals:   08/21/15 1045 08/21/15 1050  BP:  131/89  Temp:    Resp: 23 25    Last Pain: There were no vitals filed for this visit.               Rylynn Kobs A

## 2015-08-21 NOTE — Interval H&P Note (Signed)
History and Physical Interval Note:  08/21/2015 10:47 AM  Theodore Smith  has presented today for surgery, with the diagnosis of constipation, history of colon polyps  The various methods of treatment have been discussed with the patient and family. After consideration of risks, benefits and other options for treatment, the patient has consented to  Procedure(s) with comments: COLONOSCOPY WITH PROPOFOL (N/A) - 1015 as a surgical intervention .  The patient's history has been reviewed, patient examined, no change in status, stable for surgery.  I have reviewed the patient's chart and labs.  Questions were answered to the patient's satisfaction.     Theodore Smith  No change. Screening colonoscopy per plan.  The risks, benefits, limitations, alternatives and imponderables have been reviewed with the patient. Questions have been answered. All parties are agreeable.

## 2015-08-21 NOTE — Transfer of Care (Addendum)
Immediate Anesthesia Transfer of Care Note  Patient: Tobi BastosHenry L Mcomber  Procedure(s) Performed: Procedure(s) with comments: COLONOSCOPY WITH PROPOFOL (N/A) - 1015  Patient Location: PACU  Anesthesia Type:MAC  Level of Consciousness: awake   Airway & Oxygen Therapy: Patient Spontanous Breathing and Patient connected to face mask oxygen  Post-op Assessment: Report given to RN and Post -op Vital signs reviewed and stable  Post vital signs: Reviewed and stable  Last Vitals:  Filed Vitals:   08/21/15 1045 08/21/15 1050  BP:  131/89  Temp:    Resp: 23 25    Last Pain: There were no vitals filed for this visit.    Patients Stated Pain Goal: 8 (08/21/15 1031)  Complications: No apparent anesthesia complications
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
## 2015-08-21 NOTE — Discharge Instructions (Signed)
Colonoscopy Discharge Instructions  Read the instructions outlined below and refer to this sheet in the next few weeks. These discharge instructions provide you with general information on caring for yourself after you leave the hospital. Your doctor may also give you specific instructions. While your treatment has been planned according to the most current medical practices available, unavoidable complications occasionally occur. If you have any problems or questions after discharge, call Dr. Jena Gaussourk at [phone]. ACTIVITY  You may resume your regular activity, but move at a slower pace for the next 24 hours.   Take frequent rest periods for the next 24 hours.   Walking will help get rid of the air and reduce the bloated feeling in your belly (abdomen).   No driving for 24 hours (because of the medicine (anesthesia) used during the test).    Do not sign any important legal documents or operate any machinery for 24 hours (because of the anesthesia used during the test).  NUTRITION  Drink plenty of fluids.   You may resume your normal diet as instructed by your doctor.   Begin with a light meal and progress to your normal diet. Heavy or fried foods are harder to digest and may make you feel sick to your stomach (nauseated).   Avoid alcoholic beverages for 24 hours or as instructed.  MEDICATIONS  You may resume your normal medications unless your doctor tells you otherwise.  WHAT YOU CAN EXPECT TODAY  Some feelings of bloating in the abdomen.   Passage of more gas than usual.   Spotting of blood in your stool or on the toilet paper.  IF YOU HAD POLYPS REMOVED DURING THE COLONOSCOPY:  No aspirin products for 7 days or as instructed.   No alcohol for 7 days or as instructed.   Eat a soft diet for the next 24 hours.  FINDING OUT THE RESULTS OF YOUR TEST Not all test results are available during your visit. If your test results are not back during the visit, make an appointment  with your caregiver to find out the results. Do not assume everything is normal if you have not heard from your caregiver or the medical facility. It is important for you to follow up on all of your test results.  SEEK IMMEDIATE MEDICAL ATTENTION IF:  You have more than a spotting of blood in your stool.   Your belly is swollen (abdominal distention).   You are nauseated or vomiting.   You have a temperature over 101.   You have abdominal pain or discomfort that is severe or gets worse throughout the day.    Preparation was inadequate. Colonoscopy could not be performed today.  Office visit with us in 3 months    Tuesday October 3rd, 2017 at 0800am    PATIENT INSTRUCTIONS POST-ANESTHESIA  IMMEDIATELY FOLLOWING SURGERY:  Do not drive or operate machinery for the first twenty four hours after surgery.  Do not make any important decisions for twenty four hours after surgery or while taking narcotic pain medications or sedatives.  If you develop intractable nausea and vomiting or a severe headache please notify your doctor immediately.  FOLLOW-UP:  Please make an appointment with your surgeon as instructed. You do not need to follow up with anesthesia unless specifically instructed to do so.  WOUND CARE INSTRUCTIONS (if applicable):  Keep a dry clean dressing on the anesthesia/puncture wound site if there is drainage.  Once the wound has quit draining you may leave it open to  air.  Generally you should leave the bandage intact for twenty four hours unless there is drainage.  If the epidural site drains for more than 36-48 hours please call the anesthesia department.  QUESTIONS?:  Please feel free to call your physician or the hospital operator if you have any questions, and they will be happy to assist you.

## 2015-08-24 ENCOUNTER — Ambulatory Visit (INDEPENDENT_AMBULATORY_CARE_PROVIDER_SITE_OTHER): Payer: Medicare Other | Admitting: Psychiatry

## 2015-08-24 ENCOUNTER — Encounter (HOSPITAL_COMMUNITY): Payer: Self-pay | Admitting: Internal Medicine

## 2015-08-24 VITALS — BP 90/58 | HR 98 | Ht 68.0 in | Wt 152.0 lb

## 2015-08-24 DIAGNOSIS — F200 Paranoid schizophrenia: Secondary | ICD-10-CM

## 2015-08-24 MED ORDER — RISPERIDONE 4 MG PO TABS: 4.0000 mg | ORAL_TABLET | Freq: Two times a day (BID) | ORAL | Status: DC

## 2015-08-24 MED ORDER — LORAZEPAM 2 MG PO TABS: ORAL_TABLET | ORAL | Status: DC

## 2015-08-24 MED ORDER — BENZTROPINE MESYLATE 0.5 MG PO TABS: 0.5000 mg | ORAL_TABLET | Freq: Two times a day (BID) | ORAL | Status: DC

## 2015-08-24 MED ORDER — CHLORPROMAZINE HCL 200 MG PO TABS: ORAL_TABLET | ORAL | Status: DC

## 2015-08-24 NOTE — Progress Notes (Signed)
Patient ID: Theodore Smith, male   DOB: Mar 20, 1955, 60 y.o.   MRN: [id number] Patient ID: Theodore Smith, male   DOB: 1955/05/25, 60 y.o.   MRN: [id number] Patient ID: Theodore Smith, male   DOB: [birth year], 60 y.o.   MRN: [id number] Patient ID: Theodore Smith, male   DOB: 08-22-1955, 60 y.o.   MRN: [id number] Patient ID: Theodore Smith, male   DOB: [birth year], 60 y.o.   MRN: [id number] Patient ID: Theodore Smith, male   DOB: 04/04/1955, 60 y.o.   MRN: [id number] Patient ID: Theodore Smith, male   DOB: [birth year], 60 y.o.   MRN: [id number] Patient ID: Theodore Smith, male   DOB: 08/31/1955, 60 y.o.   MRN: [id number] Patient ID: Theodore Smith, male   DOB: [birth year], 60 y.o.   MRN: [id number] Patient ID: Theodore Smith, male   DOB: 1956-01-23, 60 y.o.   MRN: [id number] Patient ID: Theodore Smith, male   DOB: 1955-03-05, 60 y.o.   MRN: [id number] [facility] [address] Progress Note Theodore Smith MRN: [id number] DOB: Oct 15, 1955 Age: 59 y.o.  Date: 08/24/2015   Chief Complaint  Patient presents with  . Schizophrenia  . Follow-up   History of presenting illness Patient is a 60 year old [place] American male who came for his followup appointment with the staff of [facility]. He has been living in a rest home for about 30 years.  Returns after 3 months with his caregiver from the rest home. He is calm today. He is somewhat difficult to understand because all his teeth have been pulled. However he answers questions appropriately.His caregiver states He recently had to have a colonoscopy but the cleanout was not good enough and they couldn't proceed with it. This is the second time this is happened. He seems to alternate between diarrhea and constipation. He is mumbling again today quite a bit. However he is eating fairly well the rest home sleeping but still is obsessed with his cigarettes. He has not been violent or agitated or having any side effects from medication  Past psychiatric history Patient  has significant history of schizophrenia disorganized type.  He [facility]  He do not recall the details of these admission however since 1999 he has been not admitted to psychiatric hospital.  He seen in this office since 1999 and has been managed on 2 antipsychotic medication.  Patient do not recall if he ever had any suicidal attempt.  Medical history Hyperlipidemia and GERD vitamin D deficiency IBS  Family History family history includes ADD / ADHD in his other; Alcohol abuse in his brother; Anxiety disorder in his brother and other; Depression in his brother and other; Drug abuse in his brother and other; Mental illness in his other. There is no history of Bipolar disorder, Dementia, OCD, Paranoid behavior, Schizophrenia, Seizures, Sexual abuse, Physical abuse, or Colon cancer.  Mental status examination Patient is fairly groomed and dressed.t He maintained limited eye contact.  His speech is poorly articulated but he was more coherent and he has been in previous visits. He is fairly cooperative and did not show any signs of agitation or anger but his attention and concentration are poor. He was  able to answer questionsappropriately today . He is  talking to himself  He denies any active or passive suicidal thinking. He is cooperative. He has no extrapyramidal side effects noted. He is alert . His insight judgment is poor  and impulse control is fair. His speech is garbled fund of knowledge is poor and memory and recall are also poor Lab Results:  Results for orders placed or performed during the hospital encounter of 08/17/15 (from the past 2016 hour(s))  CBC with Differential/Platelet   Collection Time: 08/17/15  1:00 PM  Result Value Ref Range   WBC 6.8 4.0 - 10.5 K/uL   RBC 5.09 4.22 - 5.81 MIL/uL   Hemoglobin 15.6 13.0 - 17.0 g/dL   HCT 47.845.5 29.539.0 - 62.152.0 %   MCV 89.4 78.0 - 100.0 fL   MCH 30.6 26.0 - 34.0 pg   MCHC 34.3 30.0 -  36.0 g/dL   RDW 30.814.7 65.711.5 - 84.615.5 %   Platelets 215 150 - 400 K/uL   Neutrophils Relative % 60 %   Neutro Abs 4.1 1.7 - 7.7 K/uL   Lymphocytes Relative 27 %   Lymphs Abs 1.8 0.7 - 4.0 K/uL   Monocytes Relative 10 %   Monocytes Absolute 0.7 0.1 - 1.0 K/uL   Eosinophils Relative 2 %   Eosinophils Absolute 0.1 0.0 - 0.7 K/uL   Basophils Relative 1 %   Basophils Absolute 0.0 0.0 - 0.1 K/uL  Basic metabolic panel   Collection Time: 08/17/15  1:00 PM  Result Value Ref Range   Sodium 139 135 - 145 mmol/L   Potassium 4.1 3.5 - 5.1 mmol/L   Chloride 108 101 - 111 mmol/L   CO2 23 22 - 32 mmol/L   Glucose, Bld 83 65 - 99 mg/dL   BUN 10 6 - 20 mg/dL   Creatinine, Ser 9.621.06 0.61 - 1.24 mg/dL   Calcium 9.6 8.9 - 95.210.3 mg/dL   GFR calc non Af Amer >60 >60 mL/min   GFR calc Af Amer >60 >60 mL/min   Anion gap 8 5 - 15   PCP draws routine labs and nothing is emerging as of concern.  Assessment. Axis I Schizophrenia chronic disorganized type Axis II deferred Axis III hyperlipidemia and GERD Axis IV moderate Axis V 45  Plan/Discussion: He will continue his current dosages of Risperda and Thorazine for schizophrenia and Cogentin for side effects from and psychotic His Ativan will be continued at 1 mg twice a day and 2 mg at bedtime for anxiety.  Followup in 3 months. Staff can call us at anytime if his agitation worsens. He'll return in 3 months  MEDICATIONS this encounter: Meds ordered this encounter  Medications  . risperidone (RISPERDAL) 4 MG tablet    Sig: Take 1 tablet (4 mg total) by mouth 2 (two) times daily.    Dispense:  60 tablet    Refill:  3  . chlorproMAZINE (THORAZINE) 200 MG tablet    Sig: Takes one tablet in the am, one at noon and 4 at bedtime    Dispense:  180 tablet    Refill:  3  . benztropine (COGENTIN) 0.5 MG tablet    Sig: Take 1 tablet (0.5 mg total) by mouth 2 (two) times daily.    Dispense:  60 tablet    Refill:  3  . LORazepam (ATIVAN) 2 MG tablet    Sig:  Take one half tablet twice a day and one tablet at bedtime    Dispense:  60 tablet    Refill:  3    Medical Decision Making Problem Points:  Established problem, stable/improving (1), Review of last therapy session (1) and Review of psycho-social stressors (1) Data Points:  Review or order clinical  lab tests (1) Review of medication regiment & side effects (2)  ROSS, DEBORAH, MD

## 2015-09-14 DIAGNOSIS — F209 Schizophrenia, unspecified: Secondary | ICD-10-CM | POA: Diagnosis not present

## 2015-09-14 DIAGNOSIS — K219 Gastro-esophageal reflux disease without esophagitis: Secondary | ICD-10-CM | POA: Diagnosis not present

## 2015-09-14 DIAGNOSIS — F17200 Nicotine dependence, unspecified, uncomplicated: Secondary | ICD-10-CM | POA: Diagnosis not present

## 2015-09-22 ENCOUNTER — Other Ambulatory Visit (HOSPITAL_COMMUNITY): Payer: Self-pay | Admitting: Psychiatry

## 2015-09-22 DIAGNOSIS — F200 Paranoid schizophrenia: Secondary | ICD-10-CM

## 2015-09-26 ENCOUNTER — Telehealth: Payer: Self-pay | Admitting: Gastroenterology

## 2015-09-26 NOTE — Telephone Encounter (Signed)
Theodore FanningJulie, patient had incomplete colonoscopy again due to inadequate bowel prep. This was her second attempt. I addressed with Dr. Jena Gaussourk. Given circumstances, he agrees with screening for colon cancer with the use of Cologuard. Please arrange.

## 2015-09-27 NOTE — Telephone Encounter (Signed)
Called and spoke with Jasmine DecemberSharon at [facility], explained to her that the pt needed to have a cologuard test done. Explained to her what it was. I have filled out our section of the paperwork and faxed it to the company. [facility]

## 2015-11-21 ENCOUNTER — Encounter: Payer: Self-pay | Admitting: Gastroenterology

## 2015-11-21 ENCOUNTER — Ambulatory Visit (INDEPENDENT_AMBULATORY_CARE_PROVIDER_SITE_OTHER): Payer: Medicare Other | Admitting: Gastroenterology

## 2015-11-21 VITALS — BP 132/85 | HR 84 | Temp 97.6°F | Ht 66.0 in | Wt 152.0 lb

## 2015-11-21 DIAGNOSIS — K5900 Constipation, unspecified: Secondary | ICD-10-CM

## 2015-11-21 DIAGNOSIS — Z1211 Encounter for screening for malignant neoplasm of colon: Secondary | ICD-10-CM

## 2015-11-21 MED ORDER — LINACLOTIDE 290 MCG PO CAPS: 290.0000 ug | ORAL_CAPSULE | Freq: Every day | ORAL | 0 refills | Status: DC

## 2015-11-21 MED ORDER — LINACLOTIDE 145 MCG PO CAPS: 145.0000 ug | ORAL_CAPSULE | Freq: Every day | ORAL | 0 refills | Status: AC

## 2015-11-21 NOTE — Patient Instructions (Signed)
1. Increase Linzess 290mcg daily for 5 days to help assist with stool collection for Cologuard testing. Do not take Linzess 145mcg daily on the days the 290mcg is taken. RX sent to [facility].

## 2015-11-21 NOTE — Addendum Note (Signed)
Addended by: Tiffany KocherLEWIS, Yossi Hinchman S on: 11/21/2015 08:57 AM   Modules accepted: Orders

## 2015-11-21 NOTE — Progress Notes (Signed)
CC'ED TO PCP 

## 2015-11-21 NOTE — Progress Notes (Signed)
Primary Care Physician: Avon Gully, MD  Primary Gastroenterologist:  Roetta Sessions, MD   Chief Complaint  Patient presents with  . Follow-up    pt tells family after he has BM, yet to complete stool sample    HPI: Theodore Smith is a 60 y.o. male here for follow-up. He has failed to have adequate bowel prep twice now for colon cancer screening via colonoscopy. First time was in May 2016. Recent attempt 3 months ago as well. Prep has been very poor precluding exam. Subsequently it was decided to have patient collect stool specimen for Cologuard colon cancer screening test. Patient resides in a group home. They have been having difficulty obtaining stool specimen. Patient does not tell him when he needs to go to the restroom, does not have a private bathroom and therefore they cannot leave collection container in the restroom all the time. There is been no complaints of melena, rectal bleeding. Appetite has been good. No abdominal pain. Usually several bowel movements weekly. Patient does not complain of constipation.      Current Outpatient Prescriptions  Medication Sig Dispense Refill  . acetaminophen (TYLENOL) 325 MG tablet Take 650 mg by mouth 2 (two) times daily.     . benztropine (COGENTIN) 0.5 MG tablet Take 1 tablet (0.5 mg total) by mouth 2 (two) times daily. 60 tablet 3  . bisacodyl (BISACODYL) 5 MG EC tablet Take 1 tablet (5 mg total) by mouth daily as needed for moderate constipation. 2 tablet 0  . chlorproMAZINE (THORAZINE) 200 MG tablet Takes one tablet in the am, one at noon and 4 at bedtime 180 tablet 3  . cholecalciferol (VITAMIN D) 1000 UNITS tablet Take 2,000 Units by mouth 2 (two) times daily.     Marland Kitchen lactulose (CHRONULAC) 10 GM/15ML solution Take 15 g by mouth daily.    Marland Kitchen linaclotide (LINZESS) 145 MCG CAPS capsule Take 1 capsule (145 mcg total) by mouth daily before breakfast. 30 capsule 11  . loratadine (CLARITIN) 10 MG tablet Take 10 mg by mouth daily.    Marland Kitchen  LORazepam (ATIVAN) 2 MG tablet Take one half tablet twice a day and one tablet at bedtime 60 tablet 3  . omeprazole (PRILOSEC) 20 MG capsule Take 20 mg by mouth daily.    . polyethylene glycol powder (MIRALAX) powder 17 grams at bedtime 527 g 5  . risperidone (RISPERDAL) 4 MG tablet Take 1 tablet (4 mg total) by mouth 2 (two) times daily. 60 tablet 3   No current facility-administered medications for this visit.     Allergies as of 11/21/2015  . (No Known Allergies)    ROS:  General: Negative for anorexia, weight loss, fever, chills, fatigue, weakness. ENT: Negative for hoarseness, difficulty swallowing , nasal congestion. CV: Negative for chest pain, angina, palpitations, dyspnea on exertion, peripheral edema.  Respiratory: Negative for dyspnea at rest, dyspnea on exertion, cough, sputum, wheezing.  GI: See history of present illness. GU:  Negative for dysuria, hematuria, urinary incontinence, urinary frequency, nocturnal urination.  Endo: Negative for unusual weight change.    Physical Examination:   BP 132/85   Pulse 84   Temp 97.6 F (36.4 C) (Oral)   Ht 5\' 6"  (1.676 m)   Wt 152 lb (68.9 kg)   BMI 24.53 kg/m   General: Well-nourished, well-developed in no acute distress.  Eyes: No icterus. Mouth: Oropharyngeal mucosa moist and pink , no lesions erythema or exudate. Lungs: Clear to auscultation bilaterally.  Heart: Regular  rate and rhythm, no murmurs rubs or gallops.  Abdomen: Bowel sounds are normal, nontender, nondistended, no hepatosplenomegaly or masses, no abdominal bruits or hernia , no rebound or guarding.   Extremities: No lower extremity edema. No clubbing or deformities. Neuro: Alert and oriented x 4   Skin: Warm and dry, no jaundice.   Psych: Alert and cooperative, normal mood and affect.  Labs:  Lab Results  Component Value Date   WBC 6.8 08/17/2015   HGB 15.6 08/17/2015   HCT 45.5 08/17/2015   MCV 89.4 08/17/2015   PLT 215 08/17/2015    Imaging  Studies: No results found.

## 2015-11-21 NOTE — Assessment & Plan Note (Signed)
60 year old male presenting for follow-up. We have been attempting colon cancer screening for quite some time but due to poor bowel prep, colonoscopy has been incomplete twice. Cologuard advised but they have been having difficulty at the group home in obtaining a specimen. Due to shared restroom, they're having to be careful to make sure the a specimen obtained is from the resident desired. Continue attempts. Increase Linzess temporarily to see if we can increase frequency of bowel movements with hopes of obtaining a specimen. Patient is not going daily at this point.

## 2015-11-28 DIAGNOSIS — J449 Chronic obstructive pulmonary disease, unspecified: Secondary | ICD-10-CM | POA: Diagnosis not present

## 2015-11-28 DIAGNOSIS — K219 Gastro-esophageal reflux disease without esophagitis: Secondary | ICD-10-CM | POA: Diagnosis not present

## 2015-11-28 DIAGNOSIS — D649 Anemia, unspecified: Secondary | ICD-10-CM | POA: Diagnosis not present

## 2015-11-28 DIAGNOSIS — R739 Hyperglycemia, unspecified: Secondary | ICD-10-CM | POA: Diagnosis not present

## 2015-11-28 DIAGNOSIS — E669 Obesity, unspecified: Secondary | ICD-10-CM | POA: Diagnosis not present

## 2015-11-28 DIAGNOSIS — K5900 Constipation, unspecified: Secondary | ICD-10-CM | POA: Diagnosis not present

## 2015-11-28 DIAGNOSIS — F209 Schizophrenia, unspecified: Secondary | ICD-10-CM | POA: Diagnosis not present

## 2015-11-28 DIAGNOSIS — Z23 Encounter for immunization: Secondary | ICD-10-CM | POA: Diagnosis not present

## 2015-11-28 DIAGNOSIS — F17200 Nicotine dependence, unspecified, uncomplicated: Secondary | ICD-10-CM | POA: Diagnosis not present

## 2015-12-13 DIAGNOSIS — Z1212 Encounter for screening for malignant neoplasm of rectum: Secondary | ICD-10-CM | POA: Diagnosis not present

## 2015-12-13 DIAGNOSIS — Z1211 Encounter for screening for malignant neoplasm of colon: Secondary | ICD-10-CM | POA: Diagnosis not present

## 2015-12-21 LAB — COLOGUARD: Cologuard: POSITIVE

## 2015-12-25 ENCOUNTER — Ambulatory Visit (HOSPITAL_COMMUNITY): Payer: Self-pay | Admitting: Psychiatry

## 2015-12-26 ENCOUNTER — Ambulatory Visit (INDEPENDENT_AMBULATORY_CARE_PROVIDER_SITE_OTHER): Payer: Medicare Other | Admitting: Psychiatry

## 2015-12-26 ENCOUNTER — Encounter (HOSPITAL_COMMUNITY): Payer: Self-pay | Admitting: Psychiatry

## 2015-12-26 VITALS — BP 79/55 | HR 92 | Ht 66.0 in | Wt 143.2 lb

## 2015-12-26 DIAGNOSIS — Z79899 Other long term (current) drug therapy: Secondary | ICD-10-CM

## 2015-12-26 DIAGNOSIS — F200 Paranoid schizophrenia: Secondary | ICD-10-CM | POA: Diagnosis not present

## 2015-12-26 MED ORDER — LORAZEPAM 2 MG PO TABS: ORAL_TABLET | ORAL | 3 refills | Status: AC

## 2015-12-26 MED ORDER — CHLORPROMAZINE HCL 200 MG PO TABS: ORAL_TABLET | ORAL | 3 refills | Status: AC

## 2015-12-26 MED ORDER — RISPERIDONE 4 MG PO TABS: 4.0000 mg | ORAL_TABLET | Freq: Two times a day (BID) | ORAL | 3 refills | Status: AC

## 2015-12-26 MED ORDER — BENZTROPINE MESYLATE 0.5 MG PO TABS: 0.5000 mg | ORAL_TABLET | Freq: Two times a day (BID) | ORAL | 3 refills | Status: AC

## 2015-12-26 MED ORDER — LORAZEPAM 2 MG PO TABS: ORAL_TABLET | ORAL | 3 refills | Status: DC

## 2015-12-26 NOTE — Progress Notes (Signed)
Patient ID: Tobi BastosHenry L Ebner, male   DOB: 1955/02/24, 60 y.o.   MRN: [id number] Patient ID: Tobi BastosHenry L Mcelhiney, male   DOB: 1955/02/24, 60 y.o.   MRN: [id number] Patient ID: Tobi BastosHenry L Noreen, male   DOB: 1955/02/24, 60 y.o.   MRN: [id number] Patient ID: Tobi BastosHenry L Voges, male   DOB: 1955/02/24, 60 y.o.   MRN: [id number] Patient ID: Tobi BastosHenry L Jablonski, male   DOB: 1955/02/24, 60 y.o.   MRN: [id number] Patient ID: Tobi BastosHenry L Mccarey, male   DOB: 1955/02/24, 60 y.o.   MRN: [id number] Patient ID: Tobi BastosHenry L Yi, male   DOB: 1955/02/24, 60 y.o.   MRN: [id number] Patient ID: Tobi BastosHenry L Philbrook, male   DOB: 1955/02/24, 60 y.o.   MRN: [id number] Patient ID: Tobi BastosHenry L Spadafore, male   DOB: 1955/02/24, 60 y.o.   MRN: [id number] Patient ID: Tobi BastosHenry L Furgerson, male   DOB: 1955/02/24, 60 y.o.   MRN: [id number] Patient ID: Tobi BastosHenry L Boord, male   DOB: 1955/02/24, 60 y.o.   MRN: [id number] [facility] [id number] Progress Note Tobi BastosHenry L Lafond MRN: [id number] DOB: 1955/02/24 Age: 60 y.o.  Date: 12/26/2015   Chief Complaint  Patient presents with  . Schizophrenia  . Follow-up   History of presenting illness Patient is a 60 year old [place] male who came for his followup appointment with the staff of [facility]. He has been living in a rest home for about 30 years.  Returns after 4 months with his caregiver from the rest home. He is calm today. He is somewhat difficult to understand because all his teeth have been pulled. However he answers questions appropriately.His caregiver statesThat GI has tried twice to have him do a colonoscopy and he can't complete the clean out. Recently they were able to collect specimen for: Guarded but haven't gotten the result. Of concern he continues to lose weight and looking back he has lost 50 pounds since 2015. He eats fairly well but spends most of his time smoking and walking around. All his lab work looks totally normal.  Past psychiatric history Patient has significant history of schizophrenia  disorganized type.  [facility]  He do not recall the details of these admission however since 1999 he has been not admitted to psychiatric hospital.  He seen in this office since 1999 and has been managed on 2 antipsychotic medication.  Patient do not recall if he ever had any suicidal attempt.  Medical history Hyperlipidemia and GERD vitamin D deficiency IBS  Family History family history includes ADD / ADHD in his other; Alcohol abuse in his brother; Anxiety disorder in his brother and other; Depression in his brother and other; Drug abuse in his brother and other; Mental illness in his other.  Mental status examination Patient is fairly groomed and dressed.t He maintained limited eye contact.  His speech is poorly articulated but he was more coherent and he has been in previous visits. He is fairly cooperative and did not show any signs of agitation or anger but his attention and concentration are poor. He was  able to answer questionsappropriately today . He is  talking to himself  He denies any active or passive suicidal thinking. He is cooperative. He has no extrapyramidal side effects noted. He is alert . His insight judgment is poor and impulse control is fair. His speech is garbled fund of knowledge is poor and memory and recall are also poor Lab Results:  No results  found for this or any previous visit (from the past 2016 hour(s)). PCP draws routine labs and nothing is emerging as of concern.  Assessment. Axis I Schizophrenia chronic disorganized type Axis II deferred Axis III hyperlipidemia and GERD Axis IV moderate Axis V 45  Plan/Discussion: He will continue his current dosages of Risperda and Thorazine for schizophrenia and Cogentin for side effects from and psychotic His Ativan will be continued at 1 mg twice a day and 2 mg at bedtime for anxiety.  Followup in 4 months. Staff can call us at anytime if his agitation  worsens.   MEDICATIONS this encounter: Meds ordered this encounter  Medications  . risperidone (RISPERDAL) 4 MG tablet    Sig: Take 1 tablet (4 mg total) by mouth 2 (two) times daily.    Dispense:  60 tablet    Refill:  3  . chlorproMAZINE (THORAZINE) 200 MG tablet    Sig: Takes one tablet in the am, one at noon and 4 at bedtime    Dispense:  180 tablet    Refill:  3  . benztropine (COGENTIN) 0.5 MG tablet    Sig: Take 1 tablet (0.5 mg total) by mouth 2 (two) times daily.    Dispense:  60 tablet    Refill:  3  . DISCONTD: LORazepam (ATIVAN) 2 MG tablet    Sig: Take one half tablet twice a day and one tablet at bedtime    Dispense:  60 tablet    Refill:  3  . LORazepam (ATIVAN) 2 MG tablet    Sig: Take one half tablet twice a day and one tablet at bedtime    Dispense:  60 tablet    Refill:  3    Medical Decision Making Problem Points:  Established problem, stable/improving (1), Review of last therapy session (1) and Review of psycho-social stressors (1) Data Points:  Review or order clinical lab tests (1) Review of medication regiment & side effects (2)  ROSS, DEBORAH, MD

## 2016-01-10 ENCOUNTER — Telehealth: Payer: Self-pay | Admitting: Gastroenterology

## 2016-01-10 NOTE — Telephone Encounter (Signed)
Please let patient, group home noted that his Cologuard test was positive which could indicate colorectal cancer or advanced adenoma.  As patient has been a difficult bowel prep with incomplete colonoscopies on 2 occasions now, Dr. Jena Gaussourk recommends patient to be referred to [facility] for further diagnostic studies were positive Cologuard testing.

## 2016-01-15 ENCOUNTER — Other Ambulatory Visit: Payer: Self-pay

## 2016-01-15 DIAGNOSIS — Z86010 Personal history of colonic polyps: Secondary | ICD-10-CM

## 2016-01-15 NOTE — Telephone Encounter (Signed)
Doris at [facility] is aware. She said [facility] was [facility]. Please refer.

## 2016-01-17 NOTE — Telephone Encounter (Signed)
Referral has been done and he has an appointment and the [facility] is aware of date and time

## 2016-01-24 ENCOUNTER — Other Ambulatory Visit (HOSPITAL_COMMUNITY): Payer: Self-pay | Admitting: Psychiatry

## 2016-01-24 DIAGNOSIS — F200 Paranoid schizophrenia: Secondary | ICD-10-CM

## 2016-02-28 DIAGNOSIS — K219 Gastro-esophageal reflux disease without esophagitis: Secondary | ICD-10-CM | POA: Diagnosis not present

## 2016-02-28 DIAGNOSIS — F209 Schizophrenia, unspecified: Secondary | ICD-10-CM | POA: Diagnosis not present

## 2016-02-28 DIAGNOSIS — F17200 Nicotine dependence, unspecified, uncomplicated: Secondary | ICD-10-CM | POA: Diagnosis not present

## 2016-03-25 DIAGNOSIS — K5909 Other constipation: Secondary | ICD-10-CM | POA: Diagnosis not present

## 2016-03-25 DIAGNOSIS — F209 Schizophrenia, unspecified: Secondary | ICD-10-CM | POA: Diagnosis not present

## 2016-03-25 DIAGNOSIS — R195 Other fecal abnormalities: Secondary | ICD-10-CM | POA: Diagnosis not present

## 2016-03-30 ENCOUNTER — Inpatient Hospital Stay (HOSPITAL_COMMUNITY): Payer: Medicare Other

## 2016-03-30 ENCOUNTER — Encounter (HOSPITAL_COMMUNITY): Payer: Self-pay | Admitting: Emergency Medicine

## 2016-03-30 ENCOUNTER — Emergency Department (HOSPITAL_COMMUNITY): Payer: Medicare Other

## 2016-03-30 ENCOUNTER — Inpatient Hospital Stay (HOSPITAL_COMMUNITY)
Admission: EM | Admit: 2016-03-30 | Discharge: 2016-04-18 | DRG: 871 | Disposition: E | Payer: Medicare Other | Attending: Internal Medicine | Admitting: Internal Medicine

## 2016-03-30 DIAGNOSIS — Y95 Nosocomial condition: Secondary | ICD-10-CM | POA: Diagnosis present

## 2016-03-30 DIAGNOSIS — F201 Disorganized schizophrenia: Secondary | ICD-10-CM | POA: Diagnosis present

## 2016-03-30 DIAGNOSIS — R4182 Altered mental status, unspecified: Secondary | ICD-10-CM | POA: Diagnosis present

## 2016-03-30 DIAGNOSIS — R159 Full incontinence of feces: Secondary | ICD-10-CM | POA: Diagnosis present

## 2016-03-30 DIAGNOSIS — D696 Thrombocytopenia, unspecified: Secondary | ICD-10-CM | POA: Diagnosis not present

## 2016-03-30 DIAGNOSIS — E785 Hyperlipidemia, unspecified: Secondary | ICD-10-CM | POA: Diagnosis present

## 2016-03-30 DIAGNOSIS — E872 Acidosis, unspecified: Secondary | ICD-10-CM | POA: Diagnosis present

## 2016-03-30 DIAGNOSIS — Z4682 Encounter for fitting and adjustment of non-vascular catheter: Secondary | ICD-10-CM | POA: Diagnosis not present

## 2016-03-30 DIAGNOSIS — A419 Sepsis, unspecified organism: Principal | ICD-10-CM | POA: Diagnosis present

## 2016-03-30 DIAGNOSIS — R404 Transient alteration of awareness: Secondary | ICD-10-CM | POA: Diagnosis not present

## 2016-03-30 DIAGNOSIS — I469 Cardiac arrest, cause unspecified: Secondary | ICD-10-CM | POA: Diagnosis present

## 2016-03-30 DIAGNOSIS — F17210 Nicotine dependence, cigarettes, uncomplicated: Secondary | ICD-10-CM | POA: Diagnosis present

## 2016-03-30 DIAGNOSIS — Z79899 Other long term (current) drug therapy: Secondary | ICD-10-CM

## 2016-03-30 DIAGNOSIS — J189 Pneumonia, unspecified organism: Secondary | ICD-10-CM | POA: Diagnosis not present

## 2016-03-30 DIAGNOSIS — G9340 Encephalopathy, unspecified: Secondary | ICD-10-CM | POA: Diagnosis not present

## 2016-03-30 DIAGNOSIS — K219 Gastro-esophageal reflux disease without esophagitis: Secondary | ICD-10-CM | POA: Diagnosis present

## 2016-03-30 DIAGNOSIS — R6521 Severe sepsis with septic shock: Secondary | ICD-10-CM | POA: Diagnosis not present

## 2016-03-30 DIAGNOSIS — R061 Stridor: Secondary | ICD-10-CM | POA: Diagnosis not present

## 2016-03-30 DIAGNOSIS — J440 Chronic obstructive pulmonary disease with acute lower respiratory infection: Secondary | ICD-10-CM | POA: Diagnosis present

## 2016-03-30 DIAGNOSIS — J9601 Acute respiratory failure with hypoxia: Secondary | ICD-10-CM | POA: Diagnosis not present

## 2016-03-30 DIAGNOSIS — Z66 Do not resuscitate: Secondary | ICD-10-CM | POA: Diagnosis present

## 2016-03-30 DIAGNOSIS — N179 Acute kidney failure, unspecified: Secondary | ICD-10-CM | POA: Diagnosis present

## 2016-03-30 DIAGNOSIS — Z452 Encounter for adjustment and management of vascular access device: Secondary | ICD-10-CM | POA: Diagnosis not present

## 2016-03-30 LAB — COMPREHENSIVE METABOLIC PANEL
ALBUMIN: 2.8 g/dL — AB (ref 3.5–5.0)
ALT: 120 U/L — ABNORMAL HIGH (ref 17–63)
AST: 203 U/L — ABNORMAL HIGH (ref 15–41)
Alkaline Phosphatase: 42 U/L (ref 38–126)
BILIRUBIN TOTAL: 0.5 mg/dL (ref 0.3–1.2)
BUN: 25 mg/dL — AB (ref 6–20)
CHLORIDE: 101 mmol/L (ref 101–111)
CO2: 12 mmol/L — ABNORMAL LOW (ref 22–32)
Calcium: 8.1 mg/dL — ABNORMAL LOW (ref 8.9–10.3)
Creatinine, Ser: 2.37 mg/dL — ABNORMAL HIGH (ref 0.61–1.24)
GFR calc Af Amer: 33 mL/min — ABNORMAL LOW (ref >60)
GFR calc non Af Amer: 28 mL/min — ABNORMAL LOW (ref >60)
Glucose, Bld: 371 mg/dL — ABNORMAL HIGH (ref 65–99)
Potassium: 5.1 mmol/L (ref 3.5–5.1)
SODIUM: 138 mmol/L (ref 135–145)
Total Protein: 5.4 g/dL — ABNORMAL LOW (ref 6.5–8.1)

## 2016-03-30 LAB — CBC WITH DIFFERENTIAL/PLATELET
BASOS ABS: 0 10*3/uL (ref 0.0–0.1)
BASOS PCT: 0 %
EOS ABS: 0 10*3/uL (ref 0.0–0.7)
Eosinophils Relative: 0 %
HEMATOCRIT: 35.3 % — AB (ref 39.0–52.0)
Hemoglobin: 11.1 g/dL — ABNORMAL LOW (ref 13.0–17.0)
Lymphocytes Relative: 13 %
Lymphs Abs: 1.6 10*3/uL (ref 0.7–4.0)
MCH: 31.2 pg (ref 26.0–34.0)
MCHC: 31.4 g/dL (ref 30.0–36.0)
MCV: 99.2 fL (ref 78.0–100.0)
MONO ABS: 2 10*3/uL — AB (ref 0.1–1.0)
Monocytes Relative: 17 %
Neutro Abs: 8.1 10*3/uL — ABNORMAL HIGH (ref 1.7–7.7)
Neutrophils Relative %: 69 %
PLATELETS: 137 10*3/uL — AB (ref 150–400)
RBC: 3.56 MIL/uL — ABNORMAL LOW (ref 4.22–5.81)
RDW: 14.5 % (ref 11.5–15.5)
WBC: 11.6 10*3/uL — ABNORMAL HIGH (ref 4.0–10.5)

## 2016-03-30 LAB — LACTIC ACID, PLASMA
LACTIC ACID, VENOUS: 5.3 mmol/L — AB (ref 0.5–1.9)
Lactic Acid, Venous: 8.5 mmol/L (ref 0.5–1.9)

## 2016-03-30 LAB — URINALYSIS, ROUTINE W REFLEX MICROSCOPIC
Bilirubin Urine: NEGATIVE
GLUCOSE, UA: NEGATIVE mg/dL
Ketones, ur: NEGATIVE mg/dL
LEUKOCYTES UA: NEGATIVE
Nitrite: NEGATIVE
PROTEIN: 100 mg/dL — AB
Specific Gravity, Urine: >1.03 — ABNORMAL HIGH (ref 1.005–1.030)
pH: 5.5 (ref 5.0–8.0)

## 2016-03-30 LAB — PROTIME-INR
INR: 2
INR: 1.84
PROTHROMBIN TIME: 23 s — AB (ref 11.4–15.2)
PROTHROMBIN TIME: 21.5 s — AB (ref 11.4–15.2)

## 2016-03-30 LAB — LIPASE, BLOOD: LIPASE: 25 U/L (ref 11–51)

## 2016-03-30 LAB — BLOOD GAS, ARTERIAL
ACID-BASE DEFICIT: 15.2 mmol/L — AB (ref 0.0–2.0)
Acid-base deficit: 19 mmol/L — ABNORMAL HIGH (ref 0.0–2.0)
BICARBONATE: 9 mmol/L — AB (ref 20.0–28.0)
BICARBONATE: 12 mmol/L — AB (ref 20.0–28.0)
DRAWN BY: 221791
Drawn by: 221791
FIO2: 100
FIO2: 100
LHR: 16 {breaths}/min
LHR: 35 {breaths}/min
MECHVT: 500 mL
O2 SAT: 90 %
O2 Saturation: 91 %
PATIENT TEMPERATURE: 37
PATIENT TEMPERATURE: 37
PCO2 ART: 63.9 mmHg — AB (ref 32.0–48.0)
PCO2 ART: 47.1 mmHg (ref 32.0–48.0)
PEEP/CPAP: 5 cmH2O
PEEP: 5 cmH2O
PH ART: 6.891 — AB (ref 7.350–7.450)
VT: 500 mL
pH, Arterial: 7.07 — CL (ref 7.350–7.450)
pO2, Arterial: 112 mmHg — ABNORMAL HIGH (ref 83.0–108.0)
pO2, Arterial: 94.9 mmHg (ref 83.0–108.0)

## 2016-03-30 LAB — URINALYSIS, MICROSCOPIC (REFLEX)
Squamous Epithelial / LPF: NONE SEEN
WBC, UA: NONE SEEN WBC/hpf (ref 0–5)

## 2016-03-30 LAB — CBC
HEMATOCRIT: 32.4 % — AB (ref 39.0–52.0)
Hemoglobin: 10.9 g/dL — ABNORMAL LOW (ref 13.0–17.0)
MCH: 31.1 pg (ref 26.0–34.0)
MCHC: 33.6 g/dL (ref 30.0–36.0)
MCV: 92.3 fL (ref 78.0–100.0)
Platelets: 95 10*3/uL — ABNORMAL LOW (ref 150–400)
RBC: 3.51 MIL/uL — ABNORMAL LOW (ref 4.22–5.81)
RDW: 14.9 % (ref 11.5–15.5)
WBC: 1.6 10*3/uL — AB (ref 4.0–10.5)

## 2016-03-30 LAB — PROCALCITONIN: Procalcitonin: 16.37 ng/mL

## 2016-03-30 LAB — MRSA PCR SCREENING: MRSA by PCR: NEGATIVE

## 2016-03-30 LAB — CREATININE, SERUM
Creatinine, Ser: 2.78 mg/dL — ABNORMAL HIGH (ref 0.61–1.24)
GFR calc Af Amer: 27 mL/min — ABNORMAL LOW (ref >60)
GFR, EST NON AFRICAN AMERICAN: 23 mL/min — AB (ref >60)

## 2016-03-30 LAB — CBG MONITORING, ED
GLUCOSE-CAPILLARY: 70 mg/dL (ref 65–99)
Glucose-Capillary: 160 mg/dL — ABNORMAL HIGH (ref 65–99)

## 2016-03-30 LAB — APTT: APTT: 77 s — AB (ref 24–36)

## 2016-03-30 LAB — CORTISOL: Cortisol, Plasma: 13.6 ug/dL

## 2016-03-30 LAB — TROPONIN I: Troponin I: 0.46 ng/mL (ref <0.03)

## 2016-03-30 LAB — BRAIN NATRIURETIC PEPTIDE: B NATRIURETIC PEPTIDE 5: 39 pg/mL (ref 0.0–100.0)

## 2016-03-30 MED ORDER — PHENYLEPHRINE HCL 10 MG/ML IJ SOLN
0.0000 ug/min | INTRAMUSCULAR | Status: DC
  Administered 2016-03-30: 180 ug/min via INTRAVENOUS
  Filled 2016-03-30 (×2): qty 4

## 2016-03-30 MED ORDER — DEXTROSE 5 % IV SOLN
500.0000 mg | INTRAVENOUS | Status: DC
  Administered 2016-03-30: 500 mg via INTRAVENOUS
  Filled 2016-03-30 (×3): qty 500

## 2016-03-30 MED ORDER — DEXTROSE 50 % IV SOLN
1.0000 | Freq: Once | INTRAVENOUS | Status: AC
  Administered 2016-03-30: 50 mL via INTRAVENOUS

## 2016-03-30 MED ORDER — STERILE WATER FOR INJECTION IV SOLN
INTRAVENOUS | Status: DC
  Administered 2016-03-30: 11:00:00 via INTRAVENOUS
  Filled 2016-03-30 (×7): qty 150

## 2016-03-30 MED ORDER — STERILE WATER FOR INJECTION IV SOLN
INTRAVENOUS | Status: DC
  Filled 2016-03-30 (×8): qty 150

## 2016-03-30 MED ORDER — SODIUM BICARBONATE 8.4 % IV SOLN
50.0000 meq | Freq: Once | INTRAVENOUS | Status: AC
  Administered 2016-03-30: 50 meq via INTRAVENOUS
  Filled 2016-03-30: qty 50

## 2016-03-30 MED ORDER — SODIUM CHLORIDE 0.9 % IV SOLN: 250.0000 mL | INTRAVENOUS | Status: DC | PRN

## 2016-03-30 MED ORDER — ACETAMINOPHEN 325 MG PO TABS: 650.0000 mg | ORAL_TABLET | ORAL | Status: DC | PRN

## 2016-03-30 MED ORDER — DEXTROSE 5 % IV SOLN
1.0000 g | Freq: Once | INTRAVENOUS | Status: AC
  Administered 2016-03-30: 1 g via INTRAVENOUS
  Filled 2016-03-30: qty 10

## 2016-03-30 MED ORDER — ASPIRIN 300 MG RE SUPP: 300.0000 mg | RECTAL | Status: DC

## 2016-03-30 MED ORDER — SODIUM BICARBONATE 8.4 % IV SOLN
INTRAVENOUS | Status: AC
  Filled 2016-03-30: qty 100

## 2016-03-30 MED ORDER — SODIUM CHLORIDE 0.9 % IV BOLUS (SEPSIS)
1000.0000 mL | Freq: Once | INTRAVENOUS | Status: AC
  Administered 2016-03-30: 1000 mL via INTRAVENOUS

## 2016-03-30 MED ORDER — CHLORHEXIDINE GLUCONATE 0.12% ORAL RINSE (MEDLINE KIT): 15.0000 mL | Freq: Two times a day (BID) | OROMUCOSAL | Status: DC

## 2016-03-30 MED ORDER — PIPERACILLIN-TAZOBACTAM 3.375 G IVPB: 3.3750 g | Freq: Three times a day (TID) | INTRAVENOUS | Status: DC

## 2016-03-30 MED ORDER — PIPERACILLIN-TAZOBACTAM 3.375 G IVPB 30 MIN
3.3750 g | Freq: Once | INTRAVENOUS | Status: DC
  Filled 2016-03-30: qty 50

## 2016-03-30 MED ORDER — VANCOMYCIN HCL IN DEXTROSE 1-5 GM/200ML-% IV SOLN: 1000.0000 mg | Freq: Once | INTRAVENOUS | Status: DC

## 2016-03-30 MED ORDER — DEXTROSE 50 % IV SOLN
INTRAVENOUS | Status: AC
  Filled 2016-03-30: qty 50

## 2016-03-30 MED ORDER — SODIUM CHLORIDE 0.9 % IV SOLN
0.0000 ug/min | INTRAVENOUS | Status: DC
  Administered 2016-03-30: 20 ug/min via INTRAVENOUS
  Filled 2016-03-30: qty 1

## 2016-03-30 MED ORDER — ASPIRIN 81 MG PO CHEW: 324.0000 mg | CHEWABLE_TABLET | ORAL | Status: DC

## 2016-03-30 MED ORDER — PANTOPRAZOLE SODIUM 40 MG IV SOLR: 40.0000 mg | Freq: Every day | INTRAVENOUS | Status: DC

## 2016-03-30 MED ORDER — SODIUM CHLORIDE 0.9 % IV BOLUS (SEPSIS): 1000.0000 mL | Freq: Once | INTRAVENOUS | Status: DC

## 2016-03-30 MED ORDER — SODIUM BICARBONATE 8.4 % IV SOLN
100.0000 meq | Freq: Once | INTRAVENOUS | Status: AC
  Administered 2016-03-30: 100 meq via INTRAVENOUS

## 2016-03-30 MED ORDER — VANCOMYCIN HCL 10 G IV SOLR
1500.0000 mg | Freq: Once | INTRAVENOUS | Status: DC
  Filled 2016-03-30: qty 1500

## 2016-03-30 MED ORDER — NOREPINEPHRINE BITARTRATE 1 MG/ML IV SOLN
0.0000 ug/min | INTRAVENOUS | Status: DC
  Administered 2016-03-30: 40 ug/min via INTRAVENOUS
  Filled 2016-03-30 (×2): qty 16

## 2016-03-30 MED ORDER — ORAL CARE MOUTH RINSE: 15.0000 mL | Freq: Four times a day (QID) | OROMUCOSAL | Status: DC

## 2016-03-30 MED ORDER — HYDROCORTISONE NA SUCCINATE PF 100 MG IJ SOLR: 100.0000 mg | Freq: Three times a day (TID) | INTRAMUSCULAR | Status: DC

## 2016-03-30 MED ORDER — VANCOMYCIN HCL IN DEXTROSE 1-5 GM/200ML-% IV SOLN: 1000.0000 mg | INTRAVENOUS | Status: DC

## 2016-03-30 MED ORDER — ONDANSETRON HCL 4 MG/2ML IJ SOLN: 4.0000 mg | Freq: Four times a day (QID) | INTRAMUSCULAR | Status: DC | PRN

## 2016-03-30 MED ORDER — HEPARIN SODIUM (PORCINE) 5000 UNIT/ML IJ SOLN: 5000.0000 [IU] | Freq: Three times a day (TID) | INTRAMUSCULAR | Status: DC

## 2016-03-30 MED ORDER — SODIUM BICARBONATE 8.4 % IV SOLN: INTRAVENOUS | Status: DC

## 2016-03-30 MED ORDER — NOREPINEPHRINE BITARTRATE 1 MG/ML IV SOLN
0.0000 ug/min | INTRAVENOUS | Status: DC
  Administered 2016-03-30: 25 ug/min via INTRAVENOUS
  Filled 2016-03-30 (×2): qty 4

## 2016-03-30 MED ORDER — SODIUM CHLORIDE 0.9 % IV BOLUS (SEPSIS): 250.0000 mL | Freq: Once | INTRAVENOUS | Status: DC

## 2016-04-04 LAB — CULTURE, BLOOD (ROUTINE X 2)
Culture: NO GROWTH
Culture: NO GROWTH

## 2016-04-18 NOTE — H&P (Addendum)
History and Physical    Theodore Smith WRU:[id number] DOB: September 19, 1955 DOA: 04/04/16  PCP: Avon Gully, MD  Patient coming from: group home  Chief Complaint: cardiac arrest  HPI: Theodore Smith is a 61 y.o. male with medical history significant of schizophrenia who presented to group home. History is somewhat limited since patient is unresponsive and family cannot add much details. According to the medical record, patient had an episode of coughing and shortness of breath occurring yesterday. Today, patient was found to be in profound respiratory distress and by the time EMS arrived was found to be obtunded and had agonal breathing. He also incontinent of stool. Per EMS, he went into PEA arrest and CPR was initiated at 0747. He received several rounds of epinephrine and was intubated. Glucose was in normal range. On arrival to the ED, patient was found to be hypotensive, but did have a pulse and EKG showed normal sinus rhythm. He was started on intravenous fluids, pressors, central line was placed in the emergency room. Chest x-ray showed possible developing pneumonia. Chemistry shows elevated creatinine, serum bicarbonate of 12. ABG showed a pH of 6.8. Lactic acid of 8.5. Case was discussed with critical care medicine at [facility] who felt the patient would benefit from transfer to [facility], but did not feel it was safe to transfer him with severe hypotension. Despite being on 2 pressors, patient remained hypotensive. Case was discussed by EDP with patient's brother who agreed that a DO NOT RESUSCITATE would be reasonable. Patient would not receive further heroic measures. Since care will not be further escalated, hospitalist at [facility] were requested for admission.   Review of Systems: As per HPI otherwise 10 point review of systems negative.    Past Medical History:  Diagnosis Date  . Agitation   . GERD (gastroesophageal reflux disease)   . Hyperlipidemia   . Schizophrenia (HCC)    disorganized    Past Surgical History:  Procedure Laterality Date  . COLONOSCOPY WITH PROPOFOL N/A 07/21/2014   RMR: Inadequate bowel prep precluding complete exam. Hyperplastic rectal polyp removed. Back in 1 year with better prep.  . COLONOSCOPY WITH PROPOFOL N/A 08/21/2015   Procedure: COLONOSCOPY WITH PROPOFOL;  Surgeon: Corbin Ade, MD;  Location: [facility] ENDO SUITE;  Service: Endoscopy;  Laterality: N/A;  1015  . None to date  07/04/2014  . POLYPECTOMY N/A 07/21/2014   Procedure: POLYPECTOMY;  Surgeon: Corbin Ade, MD;  Location: [facility] ORS;  Service: Endoscopy;  Laterality: N/A;  rectal  . TONSILLECTOMY       reports that he has been smoking Cigarettes.  He has a 60.00 pack-year smoking history. He has never used smokeless tobacco. He reports that he does not drink alcohol or use drugs.  No Known Allergies  Family History  Problem Relation Age of Onset  . Alcohol abuse Brother   . Anxiety disorder Brother   . Depression Brother   . Drug abuse Brother   . Mental illness Other   . ADD / ADHD Other   . Anxiety disorder Other   . Depression Other   . Drug abuse Other   . Bipolar disorder Neg Hx   . Dementia Neg Hx   . OCD Neg Hx   . Paranoid behavior Neg Hx   . Schizophrenia Neg Hx   . Seizures Neg Hx   . Sexual abuse Neg Hx   . Physical abuse Neg Hx   . Colon cancer Neg Hx     Prior  to Admission medications   Medication Sig Start Date End Date Taking? Authorizing Provider  acetaminophen (TYLENOL) 325 MG tablet Take 650 mg by mouth 2 (two) times daily.     Historical Provider, MD  benztropine (COGENTIN) 0.5 MG tablet Take 1 tablet (0.5 mg total) by mouth 2 (two) times daily. 12/26/15   Myrlene Broker, MD  bisacodyl (BISACODYL) 5 MG EC tablet Take 1 tablet (5 mg total) by mouth daily as needed for moderate constipation. 08/01/15   Tiffany Kocher, PA-C  chlorproMAZINE (THORAZINE) 200 MG tablet Takes one tablet in the am, one at noon and 4 at bedtime 12/26/15   Myrlene Broker, MD    cholecalciferol (VITAMIN D) 1000 UNITS tablet Take 2,000 Units by mouth 2 (two) times daily.     Historical Provider, MD  lactulose (CHRONULAC) 10 GM/15ML solution Take 15 g by mouth daily.    Historical Provider, MD  linaclotide [facility]) 145 MCG CAPS capsule Take 1 capsule (145 mcg total) by mouth daily before breakfast. 08/10/15   Tiffany Kocher, PA-C  linaclotide [facility]) 145 MCG CAPS capsule Take 1 capsule (145 mcg total) by mouth daily before breakfast. Take with already prescribed Linzess for total of QD (10/7-10/11/17). 11/21/15   Tiffany Kocher, PA-C  loratadine (CLARITIN) 10 MG tablet Take 10 mg by mouth daily.    Historical Provider, MD  LORazepam (ATIVAN) 2 MG tablet Take one half tablet twice a day and one tablet at bedtime 12/26/15   Myrlene Broker, MD  omeprazole (PRILOSEC) 20 MG capsule Take 20 mg by mouth daily.    Historical Provider, MD  polyethylene glycol powder (MIRALAX) powder 17 grams at bedtime 08/10/15   Tiffany Kocher, PA-C  risperidone (RISPERDAL) 4 MG tablet Take 1 tablet (4 mg total) by mouth 2 (two) times daily. 12/26/15   Myrlene Broker, MD    Physical Exam: Vitals:   2016-04-09 1255 04/09/2016 1300 09-Apr-2016 1320 04-09-16 1322  BP: (!) 60/43 (!) 64/45 (!) 65/43   Pulse: 72 70 75   Resp: (!) 35 (!) 35 (!) 35   Temp: (!) 93.7 F (34.3 C) (!) 93.6 F (34.2 C) (!) 93.4 F (34.1 C)   TempSrc:   Other (Comment)   SpO2: 94% 95% 94% 95%  Weight:      Height:          Constitutional: unresponsive, bloody secretions from OG tube Vitals:   04/09/16 1255 04-09-2016 1300 04-09-16 1320 04/09/2016 1322  BP: (!) 60/43 (!) 64/45 (!) 65/43   Pulse: 72 70 75   Resp: (!) 35 (!) 35 (!) 35   Temp: (!) 93.7 F (34.3 C) (!) 93.6 F (34.2 C) (!) 93.4 F (34.1 C)   TempSrc:   Other (Comment)   SpO2: 94% 95% 94% 95%  Weight:      Height:       Eyes: pupils are dilated, lids and conjunctivae normal ENMT: ET tube and OG tube in mouth Neck: normal, supple, no masses, no  thyromegaly Respiratory: clear to auscultation bilaterally, no wheezing, no crackles. Normal respiratory effort. No accessory muscle use.  Cardiovascular: Regular rate and rhythm, no murmurs / rubs / gallops. No extremity edema. 2+ pedal pulses. No carotid bruits.  Abdomen: no tenderness, no masses palpated. No hepatosplenomegaly. Bowel sounds positive.  Musculoskeletal: no clubbing / cyanosis. No joint deformity upper and lower extremities.   Skin: no rashes, lesions, ulcers. No induration Neurologic: unresponsive, no gag reflex Psychiatric: unresponsive  Labs on Admission: I have personally reviewed following labs and imaging studies  CBC:  Recent Labs Lab 04-22-2016 0829  WBC 11.6*  NEUTROABS 8.1*  HGB 11.1*  HCT 35.3*  MCV 99.2  PLT 137*   Basic Metabolic Panel:  Recent Labs Lab April 22, 2016 0829  NA 138  K 5.1  CL 101  CO2 12*  GLUCOSE 371*  BUN 25*  CREATININE 2.37*  CALCIUM 8.1*   GFR: Estimated Creatinine Clearance: 34 mL/min (by C-G formula based on SCr of 2.37 mg/dL (H)). Liver Function Tests:  Recent Labs Lab April 22, 2016 0829  AST 203*  ALT 120*  ALKPHOS 42  BILITOT 0.5  PROT 5.4*  ALBUMIN 2.8*    Recent Labs Lab 04-22-16 0829  LIPASE 25   No results for input(s): AMMONIA in the last 168 hours. Coagulation Profile:  Recent Labs Lab April 22, 2016 0829  INR 2.00   Cardiac Enzymes: No results for input(s): CKTOTAL, CKMB, CKMBINDEX, TROPONINI in the last 168 hours. BNP (last 3 results) No results for input(s): PROBNP in the last 8760 hours. HbA1C: No results for input(s): HGBA1C in the last 72 hours. CBG:  Recent Labs Lab 04-22-2016 0823 2016-04-22 0850  GLUCAP 70 160*   Lipid Profile: No results for input(s): CHOL, HDL, LDLCALC, TRIG, CHOLHDL, LDLDIRECT in the last 72 hours. Thyroid Function Tests: No results for input(s): TSH, T4TOTAL, FREET4, T3FREE, THYROIDAB in the last 72 hours. Anemia Panel: No results for input(s): VITAMINB12,  FOLATE, FERRITIN, TIBC, IRON, RETICCTPCT in the last 72 hours. Urine analysis:    Component Value Date/Time   COLORURINE YELLOW 04-22-2016 0828   APPEARANCEUR CLOUDY (A) April 22, 2016 0828   LABSPEC >1.030 (H) 2016/04/22 0828   PHURINE 5.5 April 22, 2016 0828   GLUCOSEU NEGATIVE 04/22/2016 0828   HGBUR LARGE (A) 2016-04-22 0828   BILIRUBINUR NEGATIVE 2016-04-22 0828   KETONESUR NEGATIVE 2016/04/22 0828   PROTEINUR 100 (A) Apr 22, 2016 0828   UROBILINOGEN 1.0 11/26/2013 1556   NITRITE NEGATIVE 04-22-16 0828   LEUKOCYTESUR NEGATIVE 04/22/2016 0828   Sepsis Labs: !!!!!!!!!!!!!!!!!!!!!!!!!!!!!!!!!!!!!!!!!!!! @LABRCNTIP (procalcitonin:4,lacticidven:4) )No results found for this or any previous visit (from the past 240 hour(s)).   Radiological Exams on Admission: Ct Head Wo Contrast  Result Date: 04-22-2016 CLINICAL DATA:  Post CPR.  Altered mental status. EXAM: CT HEAD WITHOUT CONTRAST TECHNIQUE: Contiguous axial images were obtained from the base of the skull through the vertex without intravenous contrast. COMPARISON:  09/13/2014 FINDINGS: Brain: No acute intracranial abnormality. Specifically, no hemorrhage, hydrocephalus, mass lesion, acute infarction, or significant intracranial injury. Vascular: No hyperdense vessel or unexpected calcification. Skull: No acute calvarial abnormality. Sinuses/Orbits: Mild mucosal thickening in the paranasal sinuses. Mastoid air cells are clear. Orbital soft tissues unremarkable. Other: None IMPRESSION: No acute intracranial abnormality. Electronically Signed   By: Charlett Nose M.D.   On: 2016/04/22 09:26   Dg Chest Portable 1 View  Result Date: 04/22/16 CLINICAL DATA:  Enteric tube placed EXAM: PORTABLE CHEST 1 VIEW COMPARISON:  Chest radiograph from earlier today. FINDINGS: Endotracheal tube tip is 4.0 cm above the carina. Enteric tube enters stomach with the tip not seen on this image. Right internal jugular central venous catheter terminates in the middle  third of the superior vena cava . Stable cardiomediastinal silhouette with normal heart size. No pneumothorax. No right pleural effusion. Left costophrenic angle is excluded from the image. Diffuse linear and hazy parahilar opacities in both lungs appear stable. IMPRESSION: 1. Well-positioned support structures as described. No pneumothorax. 2. Stable diffuse linear and hazy parahilar opacities  in both lungs, which could represent pulmonary edema and/or multilobar pneumonia. Electronically Signed   By: Delbert Phenix M.D.   On: 04/14/16 11:53   Dg Chest Port 1 View  Result Date: 04-14-2016 CLINICAL DATA:  Status post central line placement. EXAM: PORTABLE CHEST 1 VIEW COMPARISON:  April 14, 2016 FINDINGS: Endotracheal tube with the tip 2.6 cm above the carina. Right jugular central venous catheter with the tip projecting over the SVC. Bilateral diffuse interstitial thickening. No pleural effusion or pneumothorax. Stable cardiomegaly. No acute osseous abnormality. IMPRESSION: 1. Endotracheal tube with the tip 2.6 cm above the carina. 2. Right jugular central venous catheter with the tip projecting over the SVC. 3. Bilateral interstitial thickening concerning for mild interstitial edema. Electronically Signed   By: Elige Ko   On: Apr 14, 2016 10:43   Dg Chest Port 1 View  Result Date: 04-14-16 CLINICAL DATA:  CPR. EXAM: PORTABLE CHEST 1 VIEW COMPARISON:  09/13/2014 FINDINGS: Endotracheal tube is 3.6 cm above the carina. Diffuse interstitial prominence in the lungs. Focal airspace opacities in the upper lobes. Underline COPD. Blunting of the left costophrenic angle again noted compatible with chronic effusion or pleural thickening, stable. Heart is normal size. IMPRESSION: COPD. Diffuse interstitial prominence within the lungs which could reflect interstitial edema. More focal opacities in the upper lobes, right greater than left. Electronically Signed   By: Charlett Nose M.D.   On: 04-14-16 08:44    EKG:  Independently reviewed. ST depressions and T wave inversions in inferolateral leads  Assessment/Plan Active Problems:   Altered mental state   Cardiac arrest (HCC)   Healthcare-associated pneumonia   Septic shock (HCC)   AKI (acute kidney injury) (HCC)   Metabolic acidosis   Thrombocytopenia (HCC)   Encephalopathy   1. Cardiac arrest. Etiology of arrest is not entirely clear. Question aspiration. He is currently hypotensive. He underwent 30 minutes of CPR. Family has agreed not to pursue further CPR if patient has another arrest.  2. Septic shock. Source is possible pneumonia. Patient has received a total of 6 L of IV fluids in the emergency room. He is on 2 pressors. Will continue on Levophed and Neo-Synephrine. Check cortisol and start on Solu-Cortef. Blood cultures have been ordered. Will repeat the lactic acid.  3. Healthcare associated pneumonia versus aspiration pneumonia. He is on intravenous antibiotics.  4. Acute respiratory failure with hypoxia. Due to cardiac arrest. Continue on ventilator support. Pulmonology consult.  5. Acute encephalopathy, likely related to hypoxia. Patient underwent CPR for approximately 30 minutes. He is not requiring any sedation and is not making any purposeful movements. His prognosis is very poor.  6. Acute kidney injury. Related to hypotension and sepsis. Continue with IV fluids and follow labs.  7. Severe metabolic acidosis. Due to lactic acidosis. Currently on IV fluids and bicarbonate infusion.  8. Thrombocytopenia. Related to sepsis. Continue to monitor.  9. EKG changes. Patient does have T-wave inversions and ST depressions in inferolateral leads. Suspect this is due to demand ischemia in the setting of cardiac arrest. Troponins will be cycled, although he will not be a candidate for any aggressive management.  10. Discussion. Patient's prognosis appears to be extremely poor and he will likely not survive this hospitalization. This was  discussed with his brother. Clearly, his family is having difficulty coming to terms with the patient's prognosis, especially since these events occurred very suddenly. Will request palliative care consult if patient survives through the weekend, although his prognosis appears to be hours. I have told  his brother to inform any other family members that he feels should be present   DVT prophylaxis: heparin Code Status: DNR Family Communication: discussed with brother Disposition Plan: pending hospital course, may not survive hospitalization Consults called: pulmonology Admission status: inpatient, ICU  Critical care time: 40 mins  Malyn Aytes MD [facility] Pager [phone]  If 7PM-7AM, please contact night-coverage [email or web address] Password [email or web address]  12-03-16, 1:31 PM

## 2016-04-18 NOTE — ED Notes (Signed)
CRITICAL VALUE ALERT  Critical value received:  ABG    Ph:  6.891 PCO2:  63.9 Po2:  112 HCO3:  9 Sat:  90%  Date of notification:  03/30/2017  Time of notification:  1045   Critical value read back:  Yes  Nurse who received alert:  Sophronia SimasJackie Anjannette Gauger  MD notified (1st page):  Dr. Adriana Simasook  Time of first page:  1047  Responding MD:  Dr. Adriana Simasook  Time MD responded:   [phone]

## 2016-04-18 NOTE — ED Triage Notes (Signed)
Patient brought in via EMS with CPR in progress from group home [facility] Per EMS called out for respiratory distress. Patient had agonal breathing upon there arrival and incontinent of stool. Staff stated he was sick since yesterday with cough and shortness of breath.  Per EMS went into cardiac arrest and CPR started at 0747. Intubated with #7 ET tube. PEA on monitor in route. Patient given 6 epi in route to ER. CBG 138. Pulses present at 0820. Patient had 700ML of IVF at this time per EMS.

## 2016-04-18 NOTE — ED Notes (Signed)
[facility] by charge ED nurse Georgette Shelliffany Vogler

## 2016-04-18 NOTE — Progress Notes (Signed)
Mr. Theodore Smith came to the floor intubated and on monitor. Placed on our bed. Mr. Theodore Smith oxygenation upon arrival was only 88% on vent initially. Blood pressure also was 54 systolic. Oxygenation started to drop around 1440 to 69%  and we tried to bag patient and oxygenation only increased to 76%. Respiratory had been called and stated that nothing else could be done. I went to get brother and sister, when I came back found that heart had stopped and I notified Dr. Kerry HoughMemon. This was expected due to history given for today before hospitalization and patient had been made a DNR.

## 2016-04-18 NOTE — Progress Notes (Signed)
Called [facility] and patient possible donor. Eye care done. Called bed control notified of death. Sister Marble Sinkquilla Parris was at bed side phone [phone] home [phone] cell. Brother Cheryln Manlyeter Huffaker also at bed side [phone] cell, [phone] home.

## 2016-04-18 NOTE — Progress Notes (Signed)
ME - Theodore Smith notified of death and patient history.  She released.

## 2016-04-18 NOTE — Progress Notes (Signed)
Pharmacy Antibiotic Note  Theodore Smith is a 61 y.o. male admitted on 09/10/16 with sepsis.  Pharmacy has been consulted for vancomycin and zosyn dosing.  Plan: Vancomycin 1500mg  loading dose then 1000mg   IV every 24 hours.  Goal trough 15-20 mcg/mL. Zosyn 3.375g IV q8h (4 hour infusion).  F/U cxs and clinical progress Monitor V/S, labs, and levels as indicated  Height: 6' (182.9 cm) Weight: 160 lb (72.6 kg) IBW/kg (Calculated) : 77.6  Temp (24hrs), Avg:95.4 F (35.2 C), Min:93.4 F (34.1 C), Max:100.2 F (37.9 C)   Recent Labs Lab 2016/12/30 0829 2016/12/30 1051 2016/12/30 1332 2016/12/30 1446  WBC 11.6*  --   --  1.6*  CREATININE 2.37*  --   --   --   LATICACIDVEN  --  8.5* 5.3*  --     Estimated Creatinine Clearance: 34 mL/min (by C-G formula based on SCr of 2.37 mg/dL (H)).    No Known Allergies  Antimicrobials this admission: Vancomycin 2/10 >>  Zosyn 2/10 >>   Dose adjustments this admission: N/A  Microbiology results: 2/10 BCx: pending  Thank you for allowing pharmacy to be a part of this patient's care.  Elder CyphersLorie Omie Ferger, BS Pharm D, BCPS Clinical Pharmacist Pager [phone] 09/10/16 3:20 PM

## 2016-04-18 NOTE — ED Notes (Signed)
Lactic Acid 8.5.

## 2016-04-18 NOTE — ED Notes (Signed)
Pt has no sedation since arrival, no purposeful movements noted.

## 2016-04-18 NOTE — ED Provider Notes (Signed)
[facility] Provider Note   CSN: [id number] Arrival date & time: 2016-04-16  7829     History   Chief Complaint Chief Complaint  Patient presents with  . Cardiac Arrest    HPI Theodore Smith is a 61 y.o. male.  Level V caveat for unresponsiveness. History obtained from EMS and group home member. Patient apparently had a episode of coughing and dyspnea yesterday. Today his symptoms became more profound and he was in "respiratory distress". When EMS arrived, patient was obtunded with agonal breathing and incontinent of stool. Patient was in cardiac arrest and EMS initiated CPR at 0747. He was intubated with a #7 ET tube. Initial rhythm was PDA and patient was given multiple rounds of epinephrine. Glucose was 138.  Patient arrived in the ED, he was hypotensive but he had pulses and was in normal sinus rhythm.      Past Medical History:  Diagnosis Date  . Agitation   . GERD (gastroesophageal reflux disease)   . Hyperlipidemia   . Schizophrenia (HCC)    disorganized    Patient Active Problem List   Diagnosis Date Noted  . Altered mental state 04/16/16  . Colon cancer screening   . Constipation 08/01/2015  . History of colonic polyps   . Encounter for screening colonoscopy 07/04/2014  . 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 Schizo-affective schizophrenia, subchronic condition with acute exacerbation (HCC) 01/03/2011    Past Surgical History:  Procedure Laterality Date  . COLONOSCOPY WITH PROPOFOL N/A 07/21/2014   RMR: Inadequate bowel prep precluding complete exam. Hyperplastic rectal polyp removed. Back in 1 year with better prep.  . COLONOSCOPY WITH PROPOFOL N/A 08/21/2015   Procedure: COLONOSCOPY WITH PROPOFOL;  Surgeon: Corbin Ade, MD;  Location: [facility] ENDO SUITE;  Service: Endoscopy;  Laterality: N/A;  1015  . None to date  07/04/2014  . POLYPECTOMY N/A 07/21/2014   Procedure: POLYPECTOMY;  Surgeon: Corbin Ade, MD;  Location: [facility] ORS;  Service:  Endoscopy;  Laterality: N/A;  rectal  . TONSILLECTOMY         Home Medications    Prior to Admission medications   Medication Sig Start Date End Date Taking? Authorizing Provider  acetaminophen (TYLENOL) 325 MG tablet Take 650 mg by mouth 2 (two) times daily.     Historical Provider, MD  benztropine (COGENTIN) 0.5 MG tablet Take 1 tablet (0.5 mg total) by mouth 2 (two) times daily. 12/26/15   Myrlene Broker, MD  bisacodyl (BISACODYL) 5 MG EC tablet Take 1 tablet (5 mg total) by mouth daily as needed for moderate constipation. 08/01/15   Tiffany Kocher, PA-C  chlorproMAZINE (THORAZINE) 200 MG tablet Takes one tablet in the am, one at noon and 4 at bedtime 12/26/15   Myrlene Broker, MD  cholecalciferol (VITAMIN D) 1000 UNITS tablet Take 2,000 Units by mouth 2 (two) times daily.     Historical Provider, MD  lactulose (CHRONULAC) 10 GM/15ML solution Take 15 g by mouth daily.    Historical Provider, MD  linaclotide [facility] Einstein) 145 MCG CAPS capsule Take 1 capsule (145 mcg total) by mouth daily before breakfast. 08/10/15   Tiffany Kocher, PA-C  linaclotide [facility]) 145 MCG CAPS capsule Take 1 capsule (145 mcg total) by mouth daily before breakfast. Take with already prescribed Linzess for total of QD (10/7-10/11/17). 11/21/15   Tiffany Kocher, PA-C  loratadine (CLARITIN) 10 MG tablet Take 10 mg by mouth daily.    Historical Provider, MD  LORazepam (ATIVAN) 2 MG tablet Take one half tablet twice a day and one tablet at bedtime 12/26/15   Myrlene Broker, MD  omeprazole (PRILOSEC) 20 MG capsule Take 20 mg by mouth daily.    Historical Provider, MD  polyethylene glycol powder (MIRALAX) powder 17 grams at bedtime 08/10/15   Tiffany Kocher, PA-C  risperidone (RISPERDAL) 4 MG tablet Take 1 tablet (4 mg total) by mouth 2 (two) times daily. 12/26/15   Myrlene Broker, MD    Family History Family History  Problem Relation Age of Onset  . Alcohol abuse Brother   . Anxiety disorder Brother   . Depression  Brother   . Drug abuse Brother   . Mental illness Other   . ADD / ADHD Other   . Anxiety disorder Other   . Depression Other   . Drug abuse Other   . Bipolar disorder Neg Hx   . Dementia Neg Hx   . OCD Neg Hx   . Paranoid behavior Neg Hx   . Schizophrenia Neg Hx   . Seizures Neg Hx   . Sexual abuse Neg Hx   . Physical abuse Neg Hx   . Colon cancer Neg Hx     Social History Social History  Substance Use Topics  . Smoking status: Current Every Day Smoker    Packs/day: 1.50    Years: 40.00    Types: Cigarettes  . Smokeless tobacco: Never Used     Comment: one to one and a half pack daily  . Alcohol use No     Allergies   Patient has no known allergies.   Review of Systems Review of Systems  Reason unable to perform ROS: Unresponsive.     Physical Exam Updated Vital Signs BP (!) 45/32   Pulse 67   Temp 98.4 F (36.9 C)   Resp 15   Ht 6' (1.829 m)   Wt 160 lb (72.6 kg)   SpO2 97%   BMI 21.70 kg/m   Physical Exam  Constitutional:  Obtunded, hypotensive, normal pulse  HENT:  Head: Normocephalic and atraumatic.  Eyes:  Pupils were dilated  Neck: Neck supple.  Cardiovascular: Normal rate and regular rhythm.   Pulmonary/Chest:  Patient was intubated  Abdominal: Soft. Bowel sounds are normal.  Musculoskeletal: Normal range of motion.  Neurological:  Unable  Skin: Skin is warm and dry.  Psychiatric:  Unable  Nursing note and vitals reviewed.    ED Treatments / Results  Labs (all labs ordered are listed, but only abnormal results are displayed) Labs Reviewed  CBC WITH DIFFERENTIAL/PLATELET - Abnormal; Notable for the following:       Result Value   WBC 11.6 (*)    RBC 3.56 (*)    Hemoglobin 11.1 (*)    HCT 35.3 (*)    Platelets 137 (*)    Neutro Abs 8.1 (*)    Monocytes Absolute 2.0 (*)    All other components within normal limits  COMPREHENSIVE METABOLIC PANEL - Abnormal; Notable for the following:    CO2 12 (*)    Glucose, Bld 371 (*)      BUN 25 (*)    Creatinine, Ser 2.37 (*)    Calcium 8.1 (*)    Total Protein 5.4 (*)    Albumin 2.8 (*)    AST 203 (*)    ALT 120 (*)    GFR calc non Af Amer 28 (*)    GFR calc Af Amer 33 (*)  All other components within normal limits  URINALYSIS, ROUTINE W REFLEX MICROSCOPIC - Abnormal; Notable for the following:    APPearance CLOUDY (*)    Specific Gravity, Urine >1.030 (*)    Hgb urine dipstick LARGE (*)    Protein, ur 100 (*)    All other components within normal limits  PROTIME-INR - Abnormal; Notable for the following:    Prothrombin Time 23.0 (*)    All other components within normal limits  APTT - Abnormal; Notable for the following:    aPTT 77 (*)    All other components within normal limits  URINALYSIS, MICROSCOPIC (REFLEX) - Abnormal; Notable for the following:    Bacteria, UA MANY (*)    All other components within normal limits  CBG MONITORING, ED - Abnormal; Notable for the following:    Glucose-Capillary 160 (*)    All other components within normal limits  LIPASE, BLOOD  LACTIC ACID, PLASMA  LACTIC ACID, PLASMA  I-STAT TROPOININ, ED  CBG MONITORING, ED    EKG  EKG Interpretation  Date/Time:  Saturday March 30 2016 08:22:05 EST Ventricular Rate:  76 PR Interval:    QRS Duration: 101 QT Interval:  442 QTC Calculation: 497 R Axis:   42 Text Interpretation:  Sinus rhythm Borderline prolonged PR interval Right atrial enlargement RSR' in V1 or V2, probably normal variant Repol abnrm suggests ischemia, anterolateral Minimal ST elevation, anterior leads Confirmed by Adriana SimasOOK  MD, Ellamay Fors ([id number]) on 08/20/16 10:31:25 AM       Radiology Ct Head Wo Contrast  Result Date: 08/20/16 CLINICAL DATA:  Post CPR.  Altered mental status. EXAM: CT HEAD WITHOUT CONTRAST TECHNIQUE: Contiguous axial images were obtained from the base of the skull through the vertex without intravenous contrast. COMPARISON:  09/13/2014 FINDINGS: Brain: No acute intracranial abnormality.  Specifically, no hemorrhage, hydrocephalus, mass lesion, acute infarction, or significant intracranial injury. Vascular: No hyperdense vessel or unexpected calcification. Skull: No acute calvarial abnormality. Sinuses/Orbits: Mild mucosal thickening in the paranasal sinuses. Mastoid air cells are clear. Orbital soft tissues unremarkable. Other: None IMPRESSION: No acute intracranial abnormality. Electronically Signed   By: Charlett NoseKevin  Dover M.D.   On: 007/03/18 09:26   Dg Chest Port 1 View  Result Date: 08/20/16 CLINICAL DATA:  CPR. EXAM: PORTABLE CHEST 1 VIEW COMPARISON:  09/13/2014 FINDINGS: Endotracheal tube is 3.6 cm above the carina. Diffuse interstitial prominence in the lungs. Focal airspace opacities in the upper lobes. Underline COPD. Blunting of the left costophrenic angle again noted compatible with chronic effusion or pleural thickening, stable. Heart is normal size. IMPRESSION: COPD. Diffuse interstitial prominence within the lungs which could reflect interstitial edema. More focal opacities in the upper lobes, right greater than left. Electronically Signed   By: Charlett NoseKevin  Dover M.D.   On: 007/03/18 08:44    Procedures .Central Line Date/Time: 08/20/16 9:30 AM Performed by: Donnetta HutchingOOK, Loris Winrow Authorized by: Donnetta HutchingOOK, Pieter Fooks   Consent:    Consent obtained:  Emergent situation Pre-procedure details:    Hand hygiene: Hand hygiene performed prior to insertion     Sterile barrier technique: All elements of maximal sterile technique followed     Skin preparation:  2% chlorhexidine Anesthesia (see MAR for exact dosages):    Anesthesia method:  None Procedure details:    Location:  R internal jugular   Patient position:  Trendelenburg   Procedural supplies:  Triple lumen   Landmarks identified: yes     Ultrasound guidance: yes     Sterile ultrasound techniques: Sterile gel  and sterile probe covers were used     Number of attempts:  1   Successful placement: yes   Post-procedure details:     Post-procedure:  Dressing applied and line sutured   Assessment:  Blood return through all ports   Patient tolerance of procedure:  Tolerated well, no immediate complications   (including critical care time)  Medications Ordered in ED Medications  dextrose 50 % solution (not administered)  azithromycin (ZITHROMAX) 500 mg in dextrose 5 % 250 mL IVPB (500 mg Intravenous New Bag/Given 04-26-2016 0933)  norepinephrine (LEVOPHED) 4 mg in dextrose 5 % 250 mL (0.016 mg/mL) infusion (not administered)  dextrose 50 % solution 50 mL (50 mLs Intravenous Given 2016/04/26 0850)  sodium chloride 0.9 % bolus 1,000 mL (0 mLs Intravenous Stopped 04-26-2016 0933)  sodium chloride 0.9 % bolus 1,000 mL (0 mLs Intravenous Stopped April 26, 2016 0933)  cefTRIAXone (ROCEPHIN) 1 g in dextrose 5 % 50 mL IVPB (0 g Intravenous Stopped 04/26/16 0933)     Initial Impression / Assessment and Plan / ED Course  I have reviewed the triage vital signs and the nursing notes.  Pertinent labs & imaging results that were available during my care of the patient were reviewed by me and considered in my medical decision making (see chart for details).    CRITICAL CARE Performed by: Donnetta Hutching Total critical care time: 95 minutes Critical care time was exclusive of separately billable procedures and treating other patients. Critical care was necessary to treat or prevent imminent or life-threatening deterioration. Critical care was time spent personally by me on the following activities: development of treatment plan with patient and/or surrogate as well as nursing, discussions with consultants, evaluation of patient's response to treatment, examination of patient, obtaining history from patient or surrogate, ordering and performing treatments and interventions, ordering and review of laboratory studies, ordering and review of radiographic studies, pulse oximetry and re-evaluation of patient's condition.   Patient is in critical condition.  Initially, I thought THE patient had been in cardiac arrest secondary to a myocardial infarction. I'm still not certain if this is a possibility. AdditionallY, I am concern about pneumonia. Aggressive IV hydration started along with antibiotics Rocephin and Zithromax. Central line was placed by myself in the right internal jugular without major complications. Patient's blood pressure remained very soft. He is still in critical condition. Per EMS, CPR was performed for 30 minutes which is a very poor prognosis. These findings were discussed with the family members and caregivers.  Discussed with critical care doctor in [place] and [place].  Will transfer to [facility]  Final Clinical Impressions(s) / ED Diagnoses   Final diagnoses:  Community acquired pneumonia, unspecified laterality    New Prescriptions New Prescriptions   No medications on file     Donnetta Hutching, MD April 26, 2016 1043

## 2016-04-18 NOTE — Discharge Summary (Signed)
Death Summary  Theodore Smith ZOX:[id number] DOB: 1955-09-16 DOA: 04-20-2016  PCP: Avon Gully, MD  Admit date: April 20, 2016 Date of Death: 04/20/2016 Time of Death: 15:00   History of present illness:  Theodore Smith is a 61 y.o. male with medical history significant of schizophrenia who resides at a group home. Initial history was somewhat limited since patient was unresponsive and family could not contribute any details. According to the medical record, patient had an episode of coughing and shortness of breath occurring yesterday. Today, patient was found to be in profound respiratory distress and by the time EMS arrived was found to be obtunded and had agonal breathing. He was also incontinent of stool. Per EMS, he went into PEA arrest and CPR was initiated at 07:47. He received several rounds of epinephrine and was intubated. Glucose was in normal range. On arrival to the ED, patient was found to be hypotensive, but did have a pulse and EKG showed normal sinus rhythm. He had received CPR for approximately 30 minutes. He was started on intravenous fluids, pressors, central line was placed in the emergency room. Chest x-ray showed possible developing pneumonia. Chemistry showed elevated creatinine, serum bicarbonate of 12. ABG showed a pH of 6.8. Lactic acid of 8.5. Case was discussed with critical care medicine at [facility] who felt [facility] but did not feel it was safe to transfer him with severe hypotension. Despite being on 2 pressors, patient remained hypotensive. Case was discussed by EDP with patient's brother who agreed that a DO NOT RESUSCITATE would be reasonable. Patient would not receive any further heroic measures. Since care would not be further escalated, hospitalist at [facility] were requested for admission.  Patient was admitted to the ICU and continued on vasopressor support, IV fluids and intravenous antibiotics. He remained hypotensive  despite receiving vasopressors. He received bicarbonate infusion and was ordered stress dose steroids. The patient remained unresponsive, did not require any sedation, did not have any purposeful movements and did not appear to have any gag reflex. It was explained to the patient's brother that his prognosis was extremely poor and he would likely not survive this hospitalization. Upon arrival to the ICU, patient became increasingly hypoxic and would not respond to bag ventilation. Family was informed that his condition was continued to deteriorate and he subsequently went into asystole at 15:00. Family was present and was provided emotional support  Final Diagnoses:  1. Cardiac arrest.  2. Septic shock.   3. Healthcare associated pneumonia .  4. Acute respiratory failure with hypoxia.   5. Acute encephalopathy  6. Acute kidney injury.   7. Severe metabolic acidosis. Marland Kitchen  8. Thrombocytopenia.    The results of significant diagnostics from this hospitalization (including imaging, microbiology, ancillary and laboratory) are listed below for reference.    Significant Diagnostic Studies: Ct Head Wo Contrast  Result Date: April 20, 2016 CLINICAL DATA:  Post CPR.  Altered mental status. EXAM: CT HEAD WITHOUT CONTRAST TECHNIQUE: Contiguous axial images were obtained from the base of the skull through the vertex without intravenous contrast. COMPARISON:  09/13/2014 FINDINGS: Brain: No acute intracranial abnormality. Specifically, no hemorrhage, hydrocephalus, mass lesion, acute infarction, or significant intracranial injury. Vascular: No hyperdense vessel or unexpected calcification. Skull: No acute calvarial abnormality. Sinuses/Orbits: Mild mucosal thickening in the paranasal sinuses. Mastoid air cells are clear. Orbital soft tissues unremarkable. Other: None IMPRESSION: No acute intracranial abnormality. Electronically Signed   By: Charlett Nose M.D.   On: April 20, 2016  09:26   Dg Chest Portable 1  View  Result Date: 05/05/2016 CLINICAL DATA:  Enteric tube placed EXAM: PORTABLE CHEST 1 VIEW COMPARISON:  Chest radiograph from earlier today. FINDINGS: Endotracheal tube tip is 4.0 cm above the carina. Enteric tube enters stomach with the tip not seen on this image. Right internal jugular central venous catheter terminates in the middle third of the superior vena cava . Stable cardiomediastinal silhouette with normal heart size. No pneumothorax. No right pleural effusion. Left costophrenic angle is excluded from the image. Diffuse linear and hazy parahilar opacities in both lungs appear stable. IMPRESSION: 1. Well-positioned support structures as described. No pneumothorax. 2. Stable diffuse linear and hazy parahilar opacities in both lungs, which could represent pulmonary edema and/or multilobar pneumonia. Electronically Signed   By: Delbert PhenixJason A Poff M.D.   On: 003/18/2018 11:53   Dg Chest Port 1 View  Result Date: 05/05/2016 CLINICAL DATA:  Status post central line placement. EXAM: PORTABLE CHEST 1 VIEW COMPARISON:  003/18/2018 FINDINGS: Endotracheal tube with the tip 2.6 cm above the carina. Right jugular central venous catheter with the tip projecting over the SVC. Bilateral diffuse interstitial thickening. No pleural effusion or pneumothorax. Stable cardiomegaly. No acute osseous abnormality. IMPRESSION: 1. Endotracheal tube with the tip 2.6 cm above the carina. 2. Right jugular central venous catheter with the tip projecting over the SVC. 3. Bilateral interstitial thickening concerning for mild interstitial edema. Electronically Signed   By: Elige KoHetal  Patel   On: 003/18/2018 10:43   Dg Chest Port 1 View  Result Date: 05/05/2016 CLINICAL DATA:  CPR. EXAM: PORTABLE CHEST 1 VIEW COMPARISON:  09/13/2014 FINDINGS: Endotracheal tube is 3.6 cm above the carina. Diffuse interstitial prominence in the lungs. Focal airspace opacities in the upper lobes. Underline COPD. Blunting of the left costophrenic angle again  noted compatible with chronic effusion or pleural thickening, stable. Heart is normal size. IMPRESSION: COPD. Diffuse interstitial prominence within the lungs which could reflect interstitial edema. More focal opacities in the upper lobes, right greater than left. Electronically Signed   By: Charlett NoseKevin  Dover M.D.   On: 003/18/2018 08:44    Microbiology: Recent Results (from the past 240 hour(s))  MRSA PCR Screening     Status: None   Collection Time: 04-25-16  2:38 PM  Result Value Ref Range Status   MRSA by PCR NEGATIVE NEGATIVE Final    Comment:        The GeneXpert MRSA Assay (FDA approved for NASAL specimens only), is one component of a comprehensive MRSA colonization surveillance program. It is not intended to diagnose MRSA infection nor to guide or monitor treatment for MRSA infections.   Culture, blood (x 2)     Status: None (Preliminary result)   Collection Time: 04-25-16  2:48 PM  Result Value Ref Range Status   Specimen Description BLOOD CENTRAL LINE  Final   Special Requests BOTTLES DRAWN AEROBIC AND ANAEROBIC 8CC EACH  Final   Culture PENDING  Incomplete   Report Status PENDING  Incomplete  Culture, blood (x 2)     Status: None (Preliminary result)   Collection Time: 04-25-16  2:50 PM  Result Value Ref Range Status   Specimen Description BLOOD LEFT WRIST  Final   Special Requests BOTTLES DRAWN AEROBIC AND ANAEROBIC [facility]6CC EACH  Final   Culture PENDING  Incomplete   Report Status PENDING  Incomplete     Labs: Basic Metabolic Panel:  Recent Labs Lab 04-25-16 0829 04-25-16 1446  NA 138  --  K 5.1  --   CL 101  --   CO2 12*  --   GLUCOSE 371*  --   BUN 25*  --   CREATININE 2.37* 2.78*  CALCIUM 8.1*  --    Liver Function Tests:  Recent Labs Lab 04/13/2016 0829  AST 203*  ALT 120*  ALKPHOS 42  BILITOT 0.5  PROT 5.4*  ALBUMIN 2.8*    Recent Labs Lab 04/13/16 0829  LIPASE 25   No results for input(s): AMMONIA in the last 168 hours. CBC:  Recent  Labs Lab 04-13-16 0829 13-Apr-2016 1446  WBC 11.6* 1.6*  NEUTROABS 8.1*  --   HGB 11.1* 10.9*  HCT 35.3* 32.4*  MCV 99.2 92.3  PLT 137* 95*   Cardiac Enzymes:  Recent Labs Lab 04-13-16 1446  TROPONINI 0.46*   D-Dimer No results for input(s): DDIMER in the last 72 hours. BNP: Invalid input(s): POCBNP CBG:  Recent Labs Lab 04-13-16 0823 13-Apr-2016 0850  GLUCAP 70 160*   Anemia work up No results for input(s): VITAMINB12, FOLATE, FERRITIN, TIBC, IRON, RETICCTPCT in the last 72 hours. Urinalysis    Component Value Date/Time   COLORURINE YELLOW 13-Apr-2016 0828   APPEARANCEUR CLOUDY (A) 2016-04-13 0828   LABSPEC >1.030 (H) 2016/04/13 0828   PHURINE 5.5 April 13, 2016 0828   GLUCOSEU NEGATIVE 2016/04/13 0828   HGBUR LARGE (A) April 13, 2016 0828   BILIRUBINUR NEGATIVE 04-13-2016 0828   KETONESUR NEGATIVE 13-Apr-2016 0828   PROTEINUR 100 (A) Apr 13, 2016 0828   UROBILINOGEN 1.0 11/26/2013 1556   NITRITE NEGATIVE 2016-04-13 0828   LEUKOCYTESUR NEGATIVE April 13, 2016 0828   Sepsis Labs Invalid input(s): PROCALCITONIN,  WBC,  LACTICIDVEN     SIGNED:  Erick Blinks, MD  [facility] Apr 13, 2016, 5:57 PM Pager   If 7PM-7AM, please contact night-coverage [email or web address] Password [email or web address]

## 2016-04-18 NOTE — ED Notes (Signed)
CRITICAL VALUE ALERT  Critical value received:  ABG (2nd) PH 7.070 pCo2 47.1 PO2 94.9 Bicarb 12 Saturation 91%  Date of notification:  23-Mar-2016  Time of notification:  1215  Critical value read back:yes  Nurse who received alert:  Rory PercyS. Cory Kitt RN  MD notified (1st page):  B. Cook  Time of first page:  1218  MD notified (2nd page):  Time of second page:  Responding MD:  B. Adriana Simasook  Time MD responded:  1218

## 2016-04-18 DEATH — deceased

## 2016-04-24 ENCOUNTER — Ambulatory Visit (HOSPITAL_COMMUNITY): Payer: Self-pay | Admitting: Psychiatry

## 2017-07-12 IMAGING — CR DG CHEST 1V PORT
1 series · 1 of 1 positions shown · non-contrast
Comparison: 09/13/2014

CLINICAL DATA: CPR.

EXAM:
PORTABLE CHEST 1 VIEW

[portable]
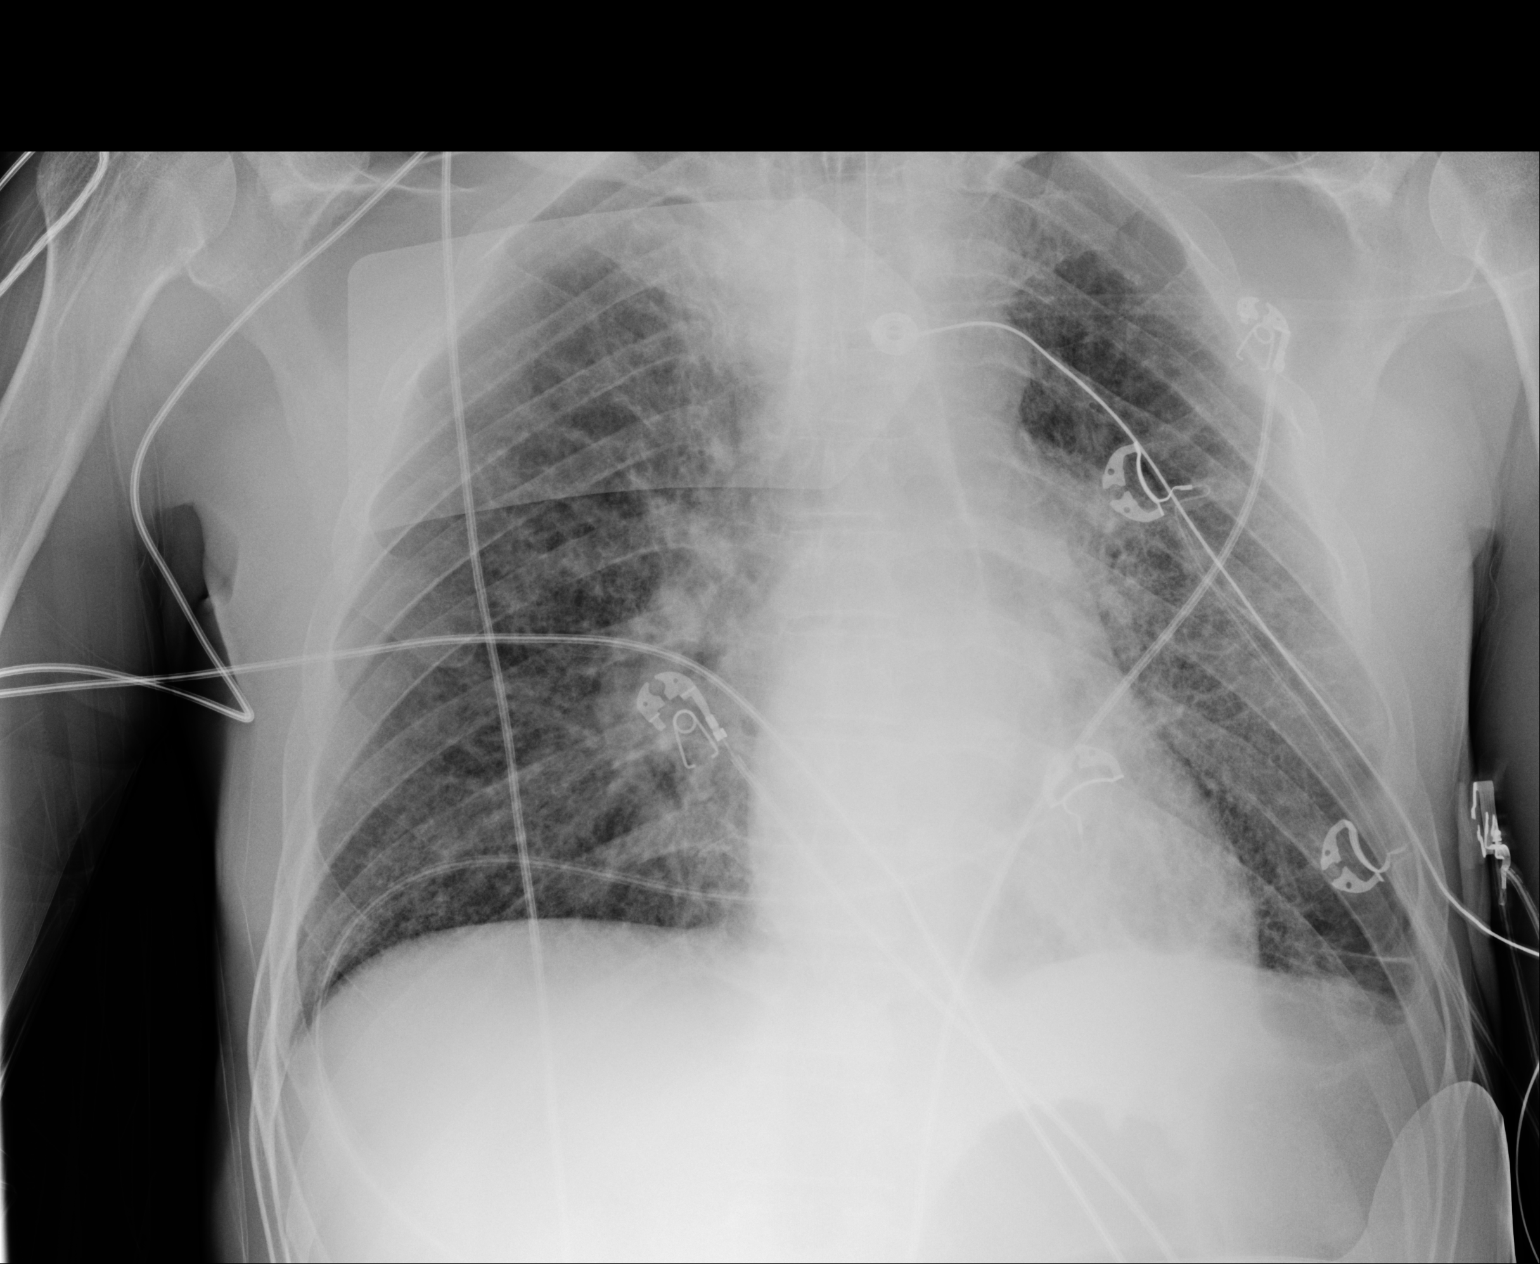

[1 of 1 positions shown; findings below may reference images not displayed]

FINDINGS: Endotracheal tube is 3.6 cm above the carina. Diffuse interstitial
prominence in the lungs. Focal airspace opacities in the upper
lobes. Underline COPD. Blunting of the left costophrenic angle again
noted compatible with chronic effusion or pleural thickening,
stable. Heart is normal size.
IMPRESSION: COPD.

Diffuse interstitial prominence within the lungs which could reflect
interstitial edema. More focal opacities in the upper lobes, right
greater than left.

## 2017-07-12 IMAGING — CT CT HEAD W/O CM
3 series · 16 of 47 positions shown, 19 images · non-contrast
Comparison: 09/13/2014

CLINICAL DATA: Post CPR.  Altered mental status.

EXAM:
CT HEAD WITHOUT CONTRAST
TECHNIQUE: Contiguous axial images were obtained from the base of the skull
through the vertex without intravenous contrast.

[Series 2: head trauma wo · axial · 0.43mm/px · z∈[+1555,+1685]mm · 10 of 32 slices shown, 13 images]
[im 3/32  brain]
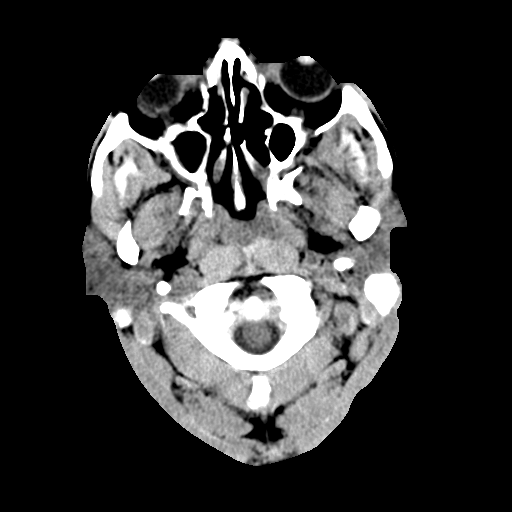
[im 3/32  bone]
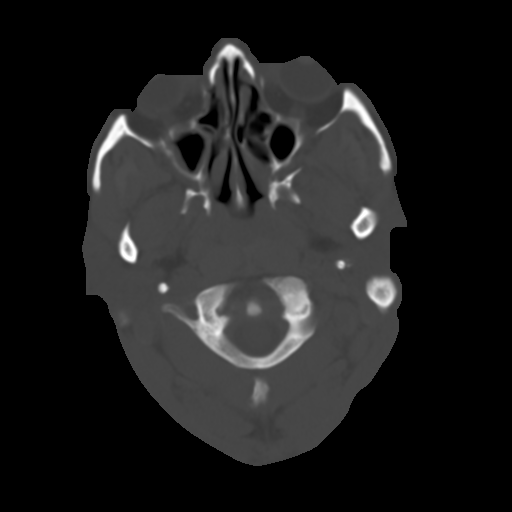
[im 6/32  brain]
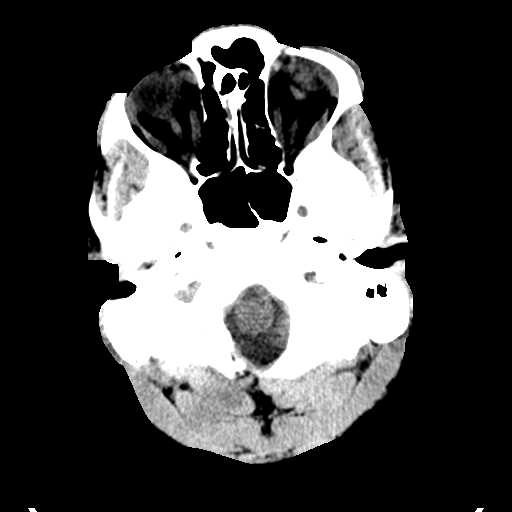
[im 9/32  brain]
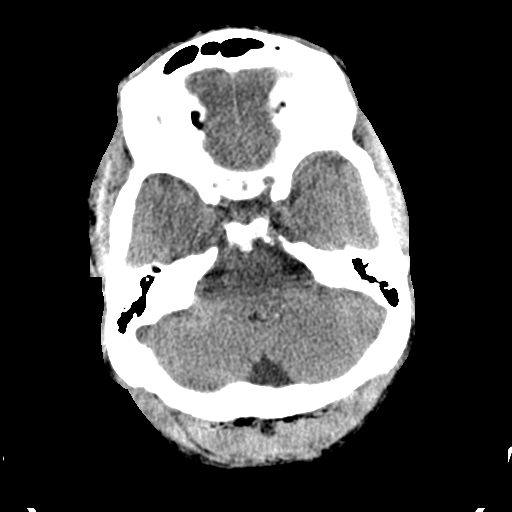
[im 11/32  brain]
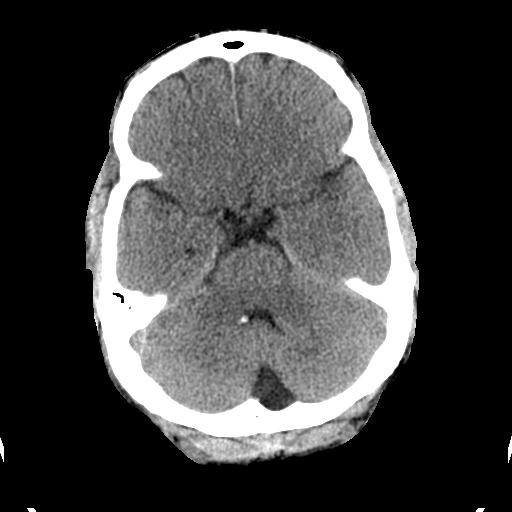
[im 14/32  brain]
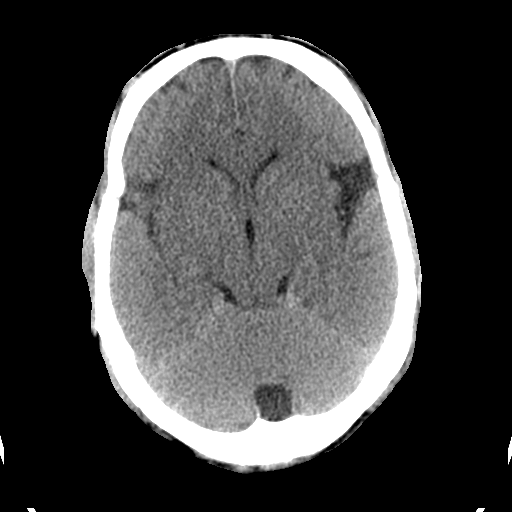
[im 14/32  bone]
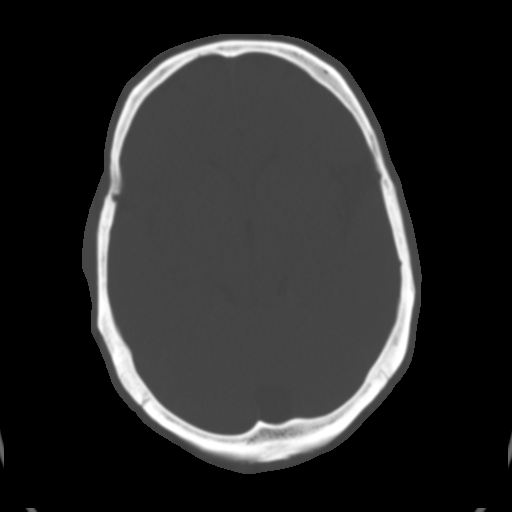
[im 18/32  brain]
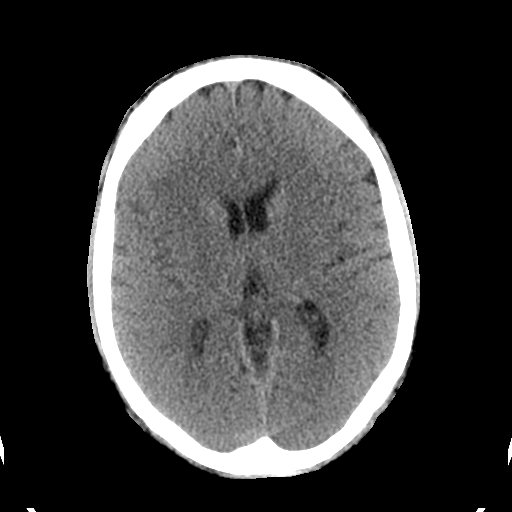
[im 21/32  brain]
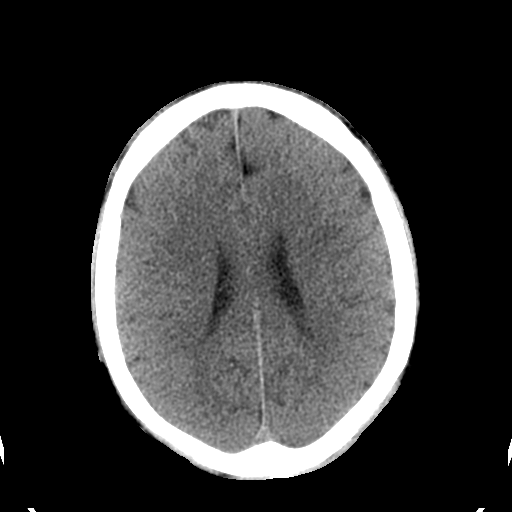
[im 24/32  brain]
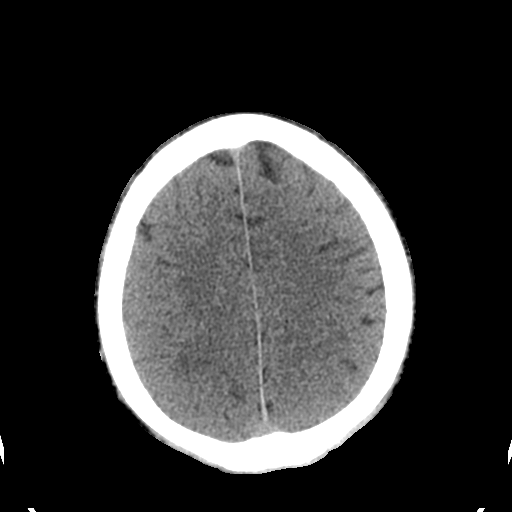
[im 26/32  brain]
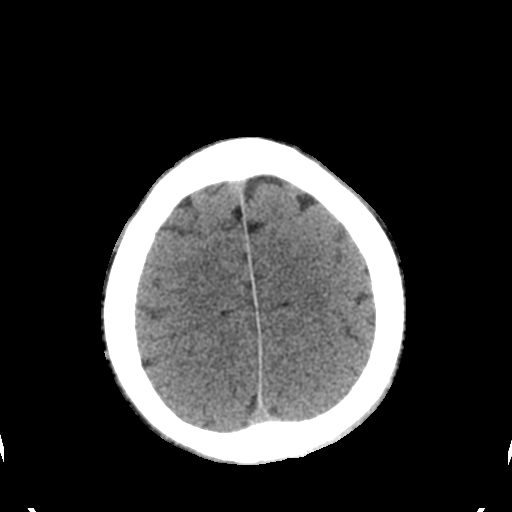
[im 26/32  bone]
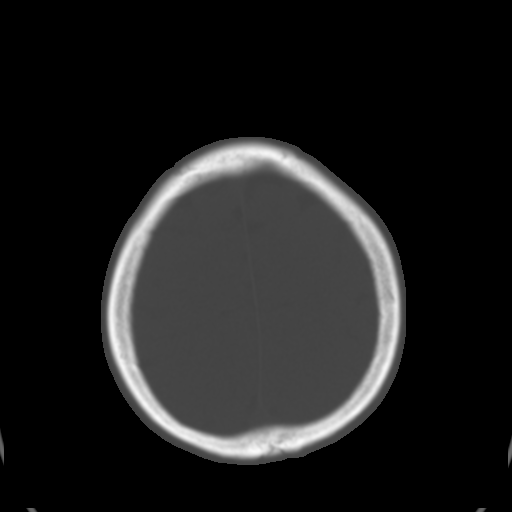
[im 29/32  brain]
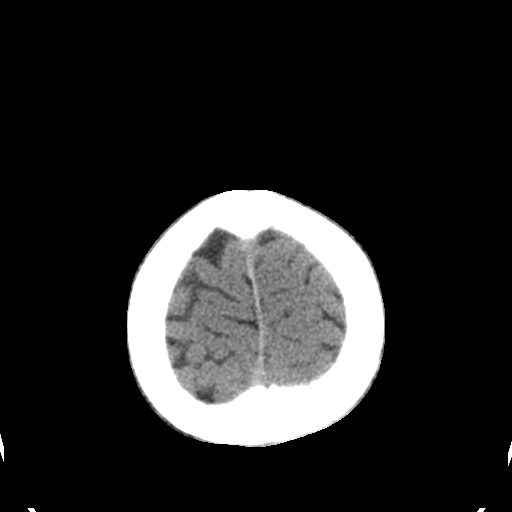

[Series 4: coronal soft tissue · coronal · 0.34mm/px · 3 of 67 slices shown]
[im 23/67  brain]
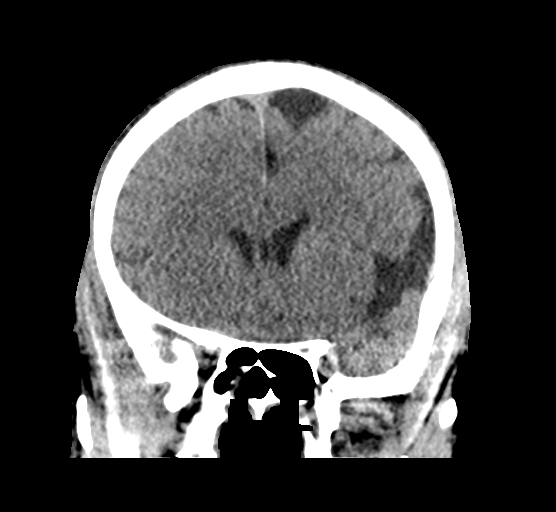
[im 30/67  brain]
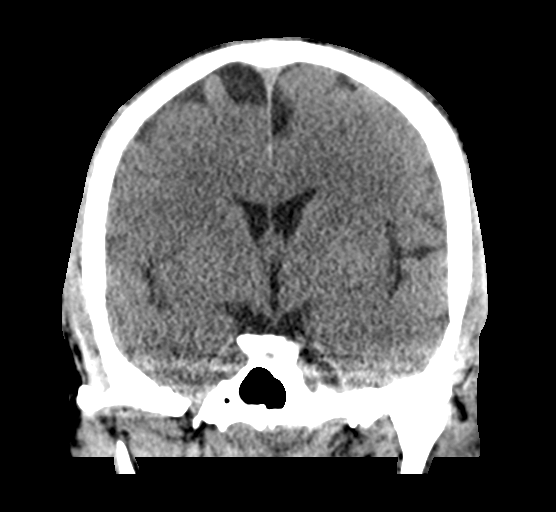
[im 37/67  brain]
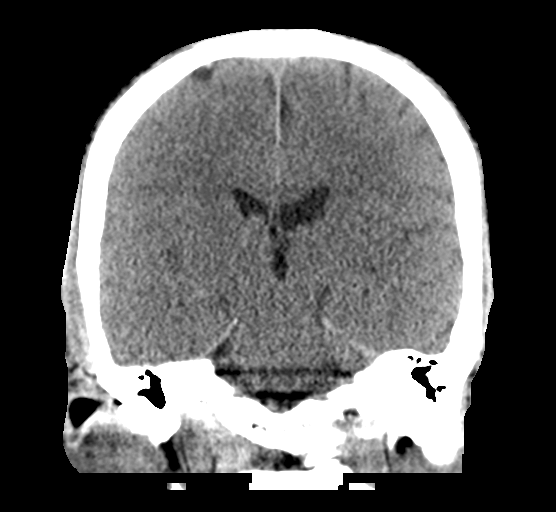

[Series 5: sagittal soft tissue · sagittal · 0.35mm/px · 3 of 56 slices shown]
[im 19/56  brain]
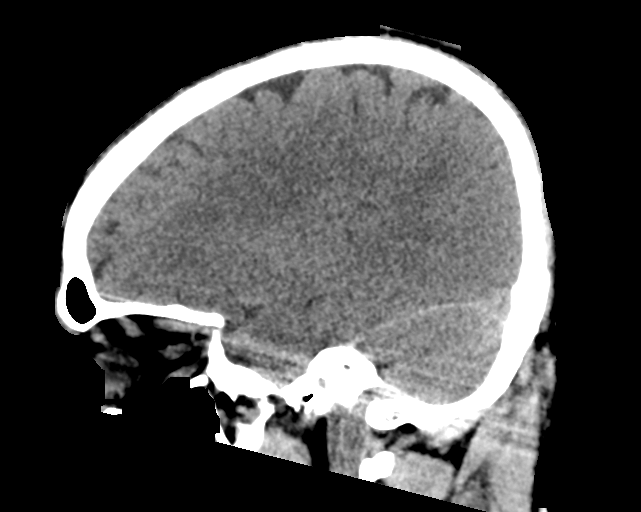
[im 28/56  brain]
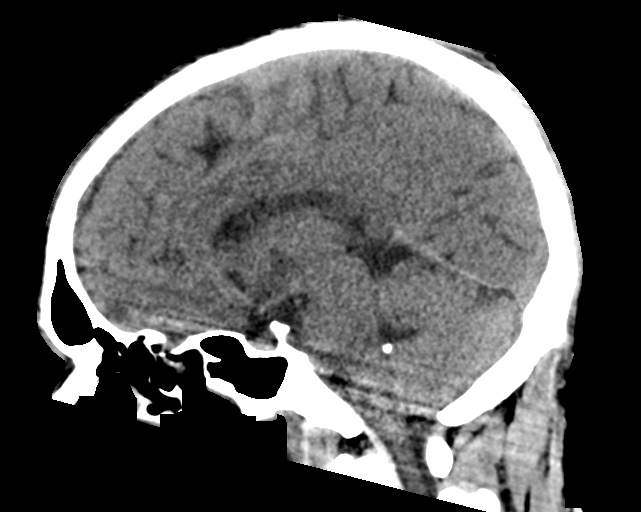
[im 37/56  brain]
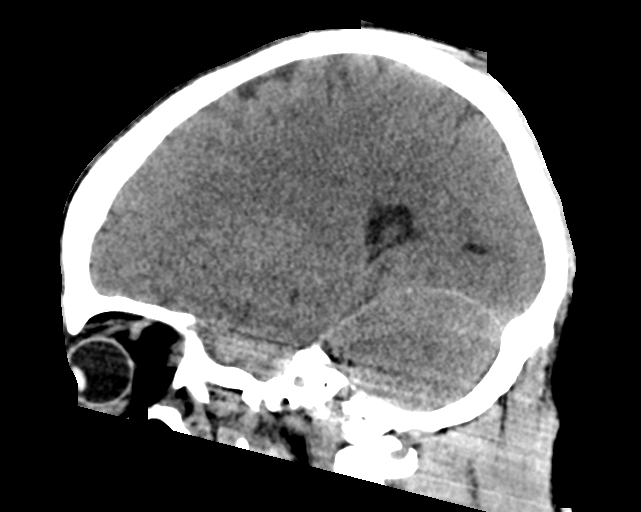

[16 of 47 positions shown; findings below may reference images not displayed]

FINDINGS: Brain: No acute intracranial abnormality. Specifically, no
hemorrhage, hydrocephalus, mass lesion, acute infarction, or
significant intracranial injury.

Vascular: No hyperdense vessel or unexpected calcification.

Skull: No acute calvarial abnormality.

Sinuses/Orbits: Mild mucosal thickening in the paranasal sinuses.
Mastoid air cells are clear. Orbital soft tissues unremarkable.

Other: None
IMPRESSION: No acute intracranial abnormality.
